# Patient Record
Sex: Female | Born: 1975 | Race: White | Hispanic: No | Marital: Married | Smoking: Current every day smoker
Health system: Southern US, Community
[De-identification: ages and names within clinical notes are randomized; demographics above are authoritative.]

## PROBLEM LIST (undated history)

## (undated) DIAGNOSIS — Z8489 Family history of other specified conditions: Secondary | ICD-10-CM

## (undated) DIAGNOSIS — E039 Hypothyroidism, unspecified: Secondary | ICD-10-CM

## (undated) DIAGNOSIS — R238 Other skin changes: Secondary | ICD-10-CM

## (undated) DIAGNOSIS — M179 Osteoarthritis of knee, unspecified: Secondary | ICD-10-CM

## (undated) DIAGNOSIS — S83206A Unspecified tear of unspecified meniscus, current injury, right knee, initial encounter: Secondary | ICD-10-CM

## (undated) DIAGNOSIS — R233 Spontaneous ecchymoses: Secondary | ICD-10-CM

## (undated) DIAGNOSIS — S83207A Unspecified tear of unspecified meniscus, current injury, left knee, initial encounter: Secondary | ICD-10-CM

## (undated) DIAGNOSIS — M26609 Unspecified temporomandibular joint disorder, unspecified side: Secondary | ICD-10-CM

## (undated) DIAGNOSIS — K5909 Other constipation: Secondary | ICD-10-CM

## (undated) DIAGNOSIS — M1710 Unilateral primary osteoarthritis, unspecified knee: Secondary | ICD-10-CM

## (undated) HISTORY — PX: OTHER SURGICAL HISTORY: SHX169

## (undated) HISTORY — PX: THYROIDECTOMY: SHX17

## (undated) HISTORY — PX: NASAL FRACTURE SURGERY: SHX718

---

## 2003-03-08 HISTORY — PX: OTHER SURGICAL HISTORY: SHX169

## 2005-10-27 ENCOUNTER — Emergency Department: Payer: Self-pay | Admitting: Internal Medicine

## 2006-02-24 ENCOUNTER — Ambulatory Visit: Payer: Self-pay | Admitting: Obstetrics and Gynecology

## 2006-03-07 HISTORY — PX: CHOLECYSTECTOMY: SHX55

## 2006-09-13 ENCOUNTER — Ambulatory Visit: Payer: Self-pay | Admitting: Family Medicine

## 2006-09-15 ENCOUNTER — Ambulatory Visit: Payer: Self-pay | Admitting: Surgery

## 2006-10-17 ENCOUNTER — Emergency Department: Payer: Self-pay | Admitting: Unknown Physician Specialty

## 2006-10-18 ENCOUNTER — Emergency Department (HOSPITAL_COMMUNITY): Admission: EM | Admit: 2006-10-18 | Discharge: 2006-10-18 | Payer: Self-pay | Admitting: Emergency Medicine

## 2006-10-20 ENCOUNTER — Ambulatory Visit (HOSPITAL_COMMUNITY): Admission: RE | Admit: 2006-10-20 | Discharge: 2006-10-20 | Payer: Self-pay | Admitting: Gastroenterology

## 2006-10-22 ENCOUNTER — Inpatient Hospital Stay (HOSPITAL_COMMUNITY): Admission: EM | Admit: 2006-10-22 | Discharge: 2006-10-25 | Payer: Self-pay | Admitting: Emergency Medicine

## 2006-11-01 ENCOUNTER — Ambulatory Visit: Payer: Self-pay | Admitting: Internal Medicine

## 2006-11-02 ENCOUNTER — Ambulatory Visit (HOSPITAL_COMMUNITY): Admission: RE | Admit: 2006-11-02 | Discharge: 2006-11-02 | Payer: Self-pay | Admitting: Gastroenterology

## 2006-12-26 ENCOUNTER — Encounter: Admission: RE | Admit: 2006-12-26 | Discharge: 2006-12-26 | Payer: Self-pay | Admitting: Gastroenterology

## 2007-03-08 HISTORY — PX: ABDOMINAL HYSTERECTOMY: SHX81

## 2007-03-08 HISTORY — PX: HYSTEROSCOPY W/ ENDOMETRIAL ABLATION: SUR665

## 2007-05-17 ENCOUNTER — Ambulatory Visit: Payer: Self-pay | Admitting: Obstetrics and Gynecology

## 2007-05-25 ENCOUNTER — Inpatient Hospital Stay: Payer: Self-pay | Admitting: Obstetrics and Gynecology

## 2007-12-06 ENCOUNTER — Encounter: Admission: RE | Admit: 2007-12-06 | Discharge: 2007-12-06 | Payer: Self-pay | Admitting: Family Medicine

## 2007-12-20 IMAGING — CR DG CHEST 2V
1 series · 2 of 2 positions shown · non-contrast
Comparison: none

REASON FOR EXAM: Chest pain
COMMENTS:   LMP: One week ago

PROCEDURE:     DXR - DXR CHEST PA (OR AP) AND LATERAL  - October 17, 2006  [DATE]
RESULT:     The lung fields are clear. The heart, mediastinal and osseous
structures show no significant abnormalities.

[Series 1: view not recorded · 0.17mm/px · 2 of 2 slices shown]
[im 1/2]
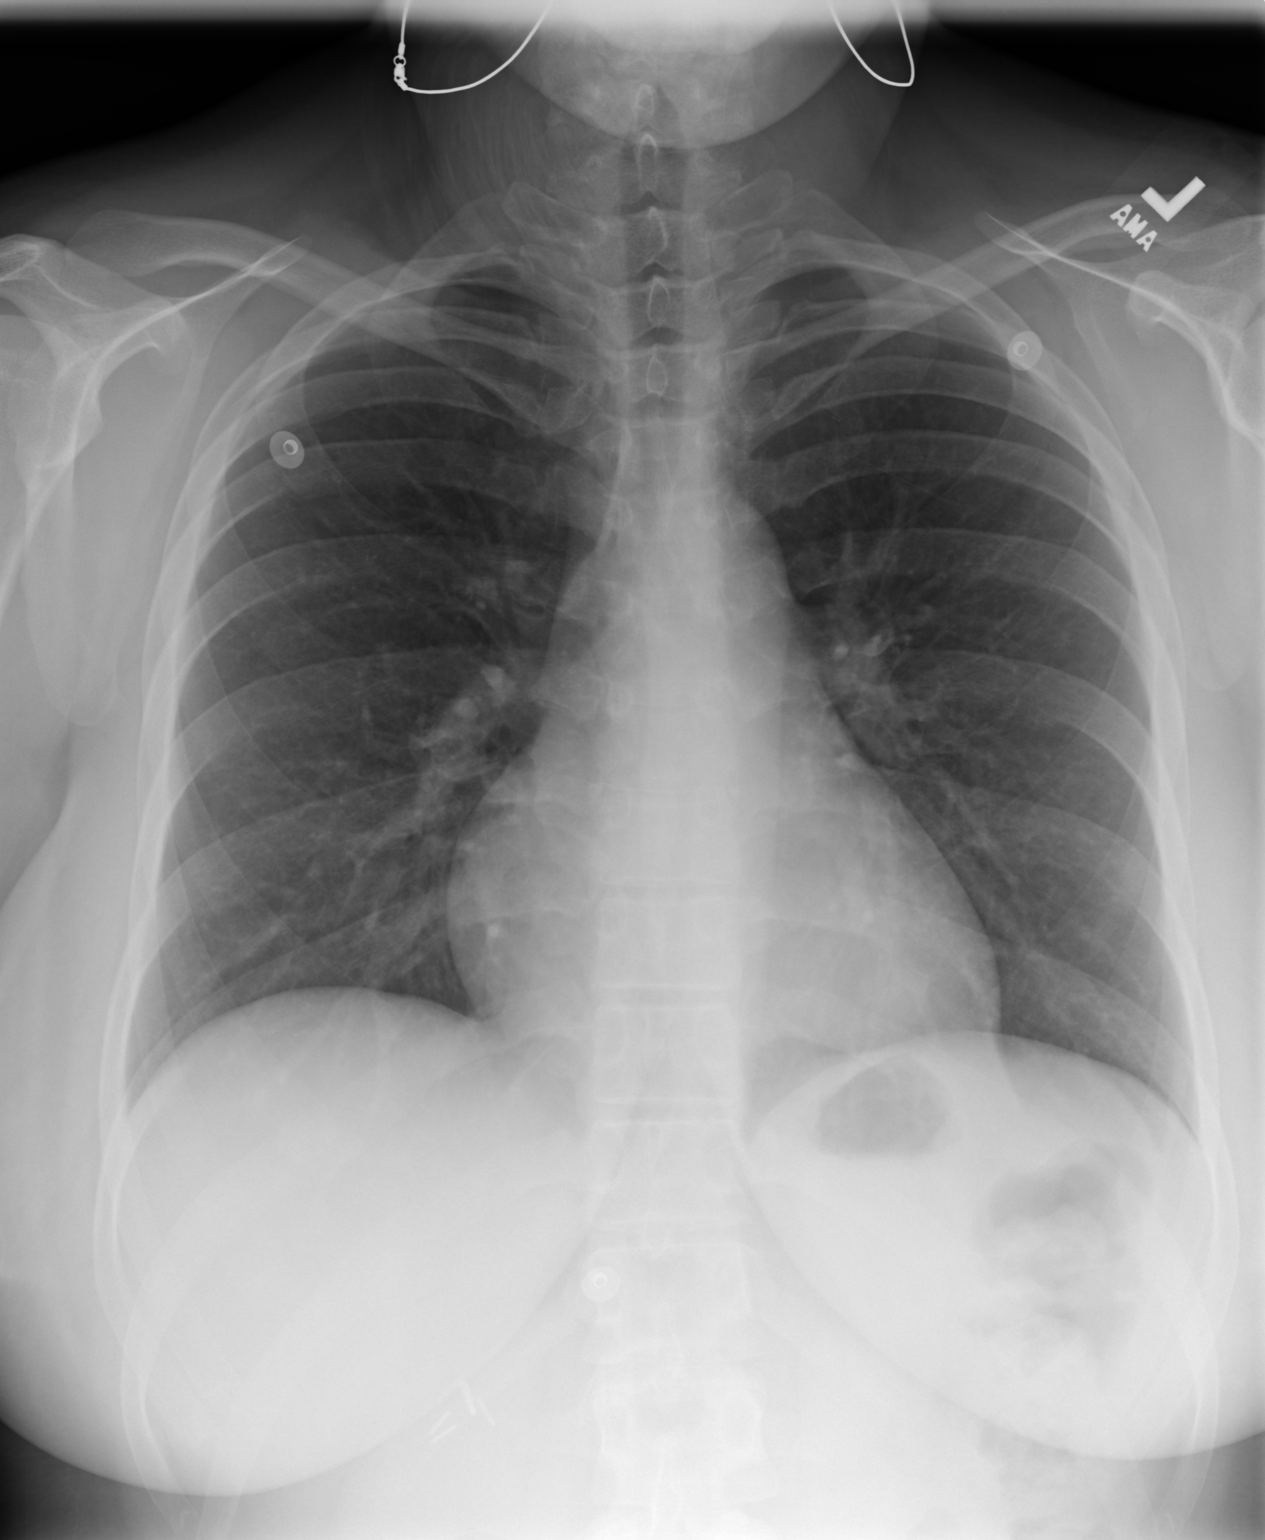
[im 2/2]
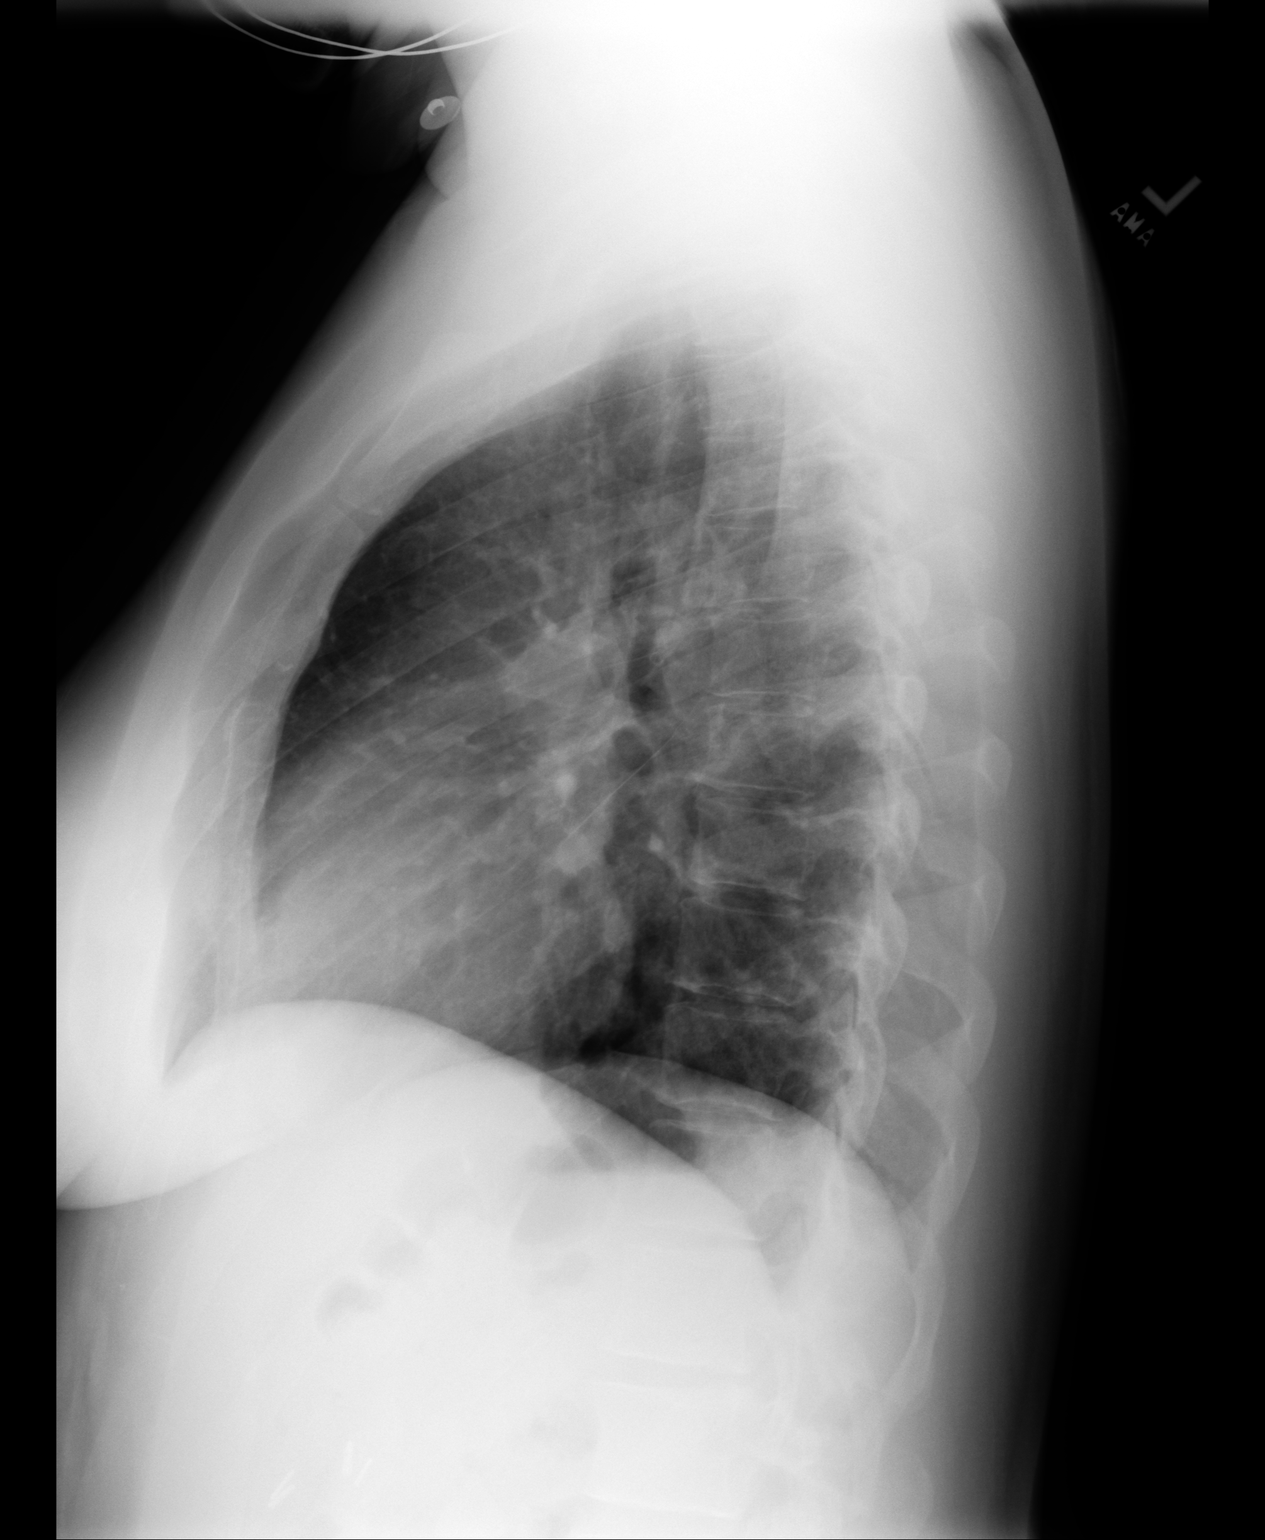

[2 of 2 positions shown; findings below may reference images not displayed]

IMPRESSION: No significant abnormalities are noted.

## 2008-03-04 ENCOUNTER — Ambulatory Visit: Payer: Self-pay | Admitting: Orthopedic Surgery

## 2008-03-15 ENCOUNTER — Ambulatory Visit: Payer: Self-pay | Admitting: Orthopedic Surgery

## 2009-05-04 ENCOUNTER — Ambulatory Visit: Payer: Self-pay | Admitting: Family Medicine

## 2009-07-17 ENCOUNTER — Ambulatory Visit (HOSPITAL_BASED_OUTPATIENT_CLINIC_OR_DEPARTMENT_OTHER): Admission: RE | Admit: 2009-07-17 | Discharge: 2009-07-17 | Payer: Self-pay | Admitting: Specialist

## 2009-07-17 HISTORY — PX: SHOULDER ARTHROSCOPY: SHX128

## 2010-05-25 LAB — BASIC METABOLIC PANEL
BUN: 8 mg/dL (ref 6–23)
CO2: 25 mEq/L (ref 19–32)
Calcium: 8.9 mg/dL (ref 8.4–10.5)
Creatinine, Ser: 0.7 mg/dL (ref 0.4–1.2)
Glucose, Bld: 97 mg/dL (ref 70–99)
Sodium: 140 mEq/L (ref 135–145)

## 2010-05-25 LAB — CBC
HCT: 34.5 % — ABNORMAL LOW (ref 36.0–46.0)
Platelets: 177 10*3/uL (ref 150–400)
WBC: 5.9 10*3/uL (ref 4.0–10.5)

## 2010-07-20 NOTE — Assessment & Plan Note (Signed)
[facility]                                 ON-CALL NOTE   [name], [name]                        MRN:          [id number]  DATE:10/21/2006                            DOB:          September 26, 1975    TELEPHONE NUMBER:  [phone]   CALLER:  Patient's husband.   TIME OF CALL:  5:27am   BODY:  I was contacted by Cassidy Walker's husband. He tells me that she  had an ERCP with stone extraction yesterday. He said that she slept most  of the day yesterday. She woke up with some substernal burning  discomfort. She took a Vicodin. Several hours later, she had some nausea  and vomiting. They noticed some coffee ground type of material. He  mentioned that the discharge instruction sheet said that if they were to  notice to bleeding or coffee ground material to contact a physician.  Aside from nausea and minimal discomfort, rated as a 3, she feels fine.  She is 35 years old with no other medical problems. They do have  Prilosec at home. I told her husband that I think that she may have  gotten sick from the Vicodin on an empty stomach and that the substernal  burning may represent some irritation from the endoscopy or some reflux.  I advised a clear liquid diet, b.i.d. proton pump inhibitor, avoidance  of Vicodin, and anti-medics p.r.n.. I anticipate that she should improve  rapidly. If not, he was instructed to contact me this weekend or Dr.  Elnoria Howard on Monday.     Wilhemina Bonito. Marina Goodell, MD  Electronically Signed    JNP/MedQ  DD: 10/21/2006  DT: 10/22/2006  Job #: [id number]   cc:   Jordan Hawks. Elnoria Howard, MD

## 2010-07-20 NOTE — Discharge Summary (Signed)
NAMESHAHAD, MAZUREK               ACCOUNT NO.:  [id number]   MEDICAL RECORD NO.:  [id number]          PATIENT TYPE:  INP   LOCATION:  1540                         FACILITY:  [facility]   PHYSICIAN:  Jordan Hawks. Elnoria Howard, MD    DATE OF BIRTH:  1976/01/09   DATE OF ADMISSION:  10/22/2006  DATE OF DISCHARGE:  10/25/2006                               DISCHARGE SUMMARY   ADMISSION DIAGNOSIS:  Post-endoscopic retrograde  cholangiopancreatography pancreatitis.   DISCHARGE DIAGNOSES:  1. Abnormal liver enzymes.  2. Resolving choledocholithiasis.   HISTORY OF PRESENT ILLNESS:  Please see the original H&P for full  details.   HOSPITAL COURSE:  The patient was admitted back to the hospital 2 days  after the ERCP.  The patient had complained of the same type of pain  that she had originally presented.  There was no evidence of any new  types of pain.  No nausea or vomiting.  She was subsequently admitted  and treated with ciprofloxacin and pain medications, as well as IV  hydration.  Her liver enzymes and total bilirubin were noted to be  elevated at 6.6, which is higher than prior to the ERCP.  Subsequently,  over the intervening days, on the day of discharge her total bilirubin  did decline down to 5.7.  She was feeling better, and the pain was much  less.  Her utilization of pain medications on markedly decreased.  A CT  scan was performed on hospital day #2 and was negative for any evidence  of acute pancreatitis.  It was felt that the patient had developed edema  at the papilla after the ERCP, which caused a slow resolution of her  symptoms.  Overall, at this time, she is feeling well and able to  tolerate p.o.  Plan for her to be is to be discharged home, and she will  follow up with Dr. Elnoria Howard in 1 week.      Jordan Hawks Elnoria Howard, MD  Electronically Signed     PDH/MEDQ  D:  10/25/2006  T:  10/26/2006  Job:  [id number]

## 2010-12-17 LAB — URINALYSIS, ROUTINE W REFLEX MICROSCOPIC
Glucose, UA: NEGATIVE
Hgb urine dipstick: NEGATIVE
Protein, ur: 30 — AB
Urobilinogen, UA: 1
pH: 6

## 2010-12-17 LAB — HEPATIC FUNCTION PANEL
Albumin: 3.1 — ABNORMAL LOW
Bilirubin, Direct: 4.1 — ABNORMAL HIGH
Indirect Bilirubin: 2.5 — ABNORMAL HIGH
Total Bilirubin: 6.6 — ABNORMAL HIGH

## 2010-12-17 LAB — COMPREHENSIVE METABOLIC PANEL
AST: 87 — ABNORMAL HIGH
Albumin: 2.9 — ABNORMAL LOW
Albumin: 3.6
Alkaline Phosphatase: 177 — ABNORMAL HIGH
Alkaline Phosphatase: 188 — ABNORMAL HIGH
BUN: 1 — ABNORMAL LOW
BUN: 11
BUN: 2 — ABNORMAL LOW
CO2: 27
CO2: 28
Calcium: 8.6
Calcium: 8.7
Chloride: 104
Chloride: 105
Creatinine, Ser: 0.75
GFR calc Af Amer: >60
GFR calc Af Amer: >60
GFR calc non Af Amer: >60
GFR calc non Af Amer: >60
Glucose, Bld: 101 — ABNORMAL HIGH
Glucose, Bld: 118 — ABNORMAL HIGH
Potassium: 3.3 — ABNORMAL LOW
Potassium: 3.5
Sodium: 139
Total Bilirubin: 5.7 — ABNORMAL HIGH
Total Protein: 6
Total Protein: 7.1

## 2010-12-17 LAB — CBC
HCT: 33 — ABNORMAL LOW
HCT: 34 — ABNORMAL LOW
HCT: 34.4 — ABNORMAL LOW
Hemoglobin: 11.5 — ABNORMAL LOW
Hemoglobin: 11.9 — ABNORMAL LOW
Hemoglobin: 12
MCHC: 34.7
MCHC: 34.9
MCV: 95.8
Platelets: 190
Platelets: 212
RBC: 3.45 — ABNORMAL LOW
RDW: 13.5
RDW: 13.8
WBC: 4.8
WBC: 5.7
WBC: 7.3

## 2010-12-17 LAB — DIFFERENTIAL
Eosinophils Absolute: 0.1
Eosinophils Relative: 1
Lymphs Abs: 1.8
Monocytes Relative: 6
Neutrophils Relative %: 68

## 2010-12-17 LAB — LIPASE, BLOOD
Lipase: 154 — ABNORMAL HIGH
Lipase: 46

## 2010-12-20 LAB — POCT PREGNANCY, URINE
Operator id: 239701
Preg Test, Ur: NEGATIVE

## 2010-12-20 LAB — DIFFERENTIAL
Eosinophils Absolute: 0.1
Eosinophils Relative: 1
Lymphocytes Relative: 31
Lymphs Abs: 1.9
Monocytes Absolute: 0.4

## 2010-12-20 LAB — CBC
MCV: 94.8
Platelets: 184
RBC: 3.72 — ABNORMAL LOW
WBC: 6.2

## 2010-12-20 LAB — COMPREHENSIVE METABOLIC PANEL
ALT: 357 — ABNORMAL HIGH
AST: 158 — ABNORMAL HIGH
Albumin: 3.7
CO2: 24
Calcium: 9
Chloride: 106
Creatinine, Ser: 0.64
GFR calc Af Amer: >60
Sodium: 137
Total Bilirubin: 4.9 — ABNORMAL HIGH

## 2010-12-20 LAB — POCT URINALYSIS DIP (DEVICE)
Glucose, UA: 100 — AB
Nitrite: NEGATIVE
Operator id: 239701
Protein, ur: NEGATIVE
Specific Gravity, Urine: >=1.03
Urobilinogen, UA: 1

## 2011-01-13 ENCOUNTER — Encounter (HOSPITAL_BASED_OUTPATIENT_CLINIC_OR_DEPARTMENT_OTHER): Payer: Self-pay | Admitting: *Deleted

## 2011-01-13 NOTE — Progress Notes (Signed)
NPO after MN with except water/gatorade until 0700. Pt to arrive at 1100. Needs hg. Will take synthroid and if needed norco w/ sips of water.

## 2011-01-14 ENCOUNTER — Encounter (HOSPITAL_BASED_OUTPATIENT_CLINIC_OR_DEPARTMENT_OTHER)
Admission: RE | Disposition: A | Discharge status: Home / Self Care | Payer: Self-pay | Source: Ambulatory Visit | Attending: Specialist

## 2011-01-14 ENCOUNTER — Ambulatory Visit (HOSPITAL_BASED_OUTPATIENT_CLINIC_OR_DEPARTMENT_OTHER): Payer: 59 | Admitting: Anesthesiology

## 2011-01-14 ENCOUNTER — Encounter (HOSPITAL_BASED_OUTPATIENT_CLINIC_OR_DEPARTMENT_OTHER): Payer: Self-pay | Admitting: Anesthesiology

## 2011-01-14 ENCOUNTER — Ambulatory Visit (HOSPITAL_BASED_OUTPATIENT_CLINIC_OR_DEPARTMENT_OTHER)
Admission: RE | Admit: 2011-01-14 | Discharge: 2011-01-14 | Disposition: A | Discharge status: Home / Self Care | Payer: 59 | Source: Ambulatory Visit | Attending: Specialist | Admitting: Specialist

## 2011-01-14 DIAGNOSIS — X58XXXA Exposure to other specified factors, initial encounter: Secondary | ICD-10-CM | POA: Insufficient documentation

## 2011-01-14 DIAGNOSIS — Z79899 Other long term (current) drug therapy: Secondary | ICD-10-CM | POA: Insufficient documentation

## 2011-01-14 DIAGNOSIS — E039 Hypothyroidism, unspecified: Secondary | ICD-10-CM | POA: Insufficient documentation

## 2011-01-14 DIAGNOSIS — S83289A Other tear of lateral meniscus, current injury, unspecified knee, initial encounter: Secondary | ICD-10-CM | POA: Insufficient documentation

## 2011-01-14 DIAGNOSIS — F411 Generalized anxiety disorder: Secondary | ICD-10-CM | POA: Insufficient documentation

## 2011-01-14 DIAGNOSIS — S83206A Unspecified tear of unspecified meniscus, current injury, right knee, initial encounter: Secondary | ICD-10-CM

## 2011-01-14 DIAGNOSIS — IMO0002 Reserved for concepts with insufficient information to code with codable children: Secondary | ICD-10-CM | POA: Insufficient documentation

## 2011-01-14 HISTORY — DX: Unspecified tear of unspecified meniscus, current injury, right knee, initial encounter: S83.206A

## 2011-01-14 HISTORY — DX: Hypothyroidism, unspecified: E03.9

## 2011-01-14 HISTORY — PX: KNEE ARTHROSCOPY: SHX127

## 2011-01-14 SURGERY — ARTHROSCOPY, KNEE
Anesthesia: General | Site: Knee | Laterality: Right | Wound class: Clean

## 2011-01-14 MED ORDER — PROPOFOL 10 MG/ML IV EMUL
INTRAVENOUS | Status: DC | PRN
  Administered 2011-01-14: 200 mg via INTRAVENOUS

## 2011-01-14 MED ORDER — CEFAZOLIN SODIUM 1-5 GM-% IV SOLN
1.0000 g | Freq: Once | INTRAVENOUS | Status: AC
  Administered 2011-01-14: 2 g via INTRAVENOUS

## 2011-01-14 MED ORDER — LIDOCAINE HCL (CARDIAC) 20 MG/ML IV SOLN
INTRAVENOUS | Status: DC | PRN
  Administered 2011-01-14: 60 mg via INTRAVENOUS

## 2011-01-14 MED ORDER — LACTATED RINGERS IV SOLN
INTRAVENOUS | Status: DC
  Administered 2011-01-14 (×3): via INTRAVENOUS

## 2011-01-14 MED ORDER — HYDROMORPHONE HCL PF 1 MG/ML IJ SOLN: 0.2500 mg | INTRAMUSCULAR | Status: DC | PRN

## 2011-01-14 MED ORDER — FENTANYL CITRATE 0.05 MG/ML IJ SOLN: 25.0000 ug | INTRAMUSCULAR | Status: DC | PRN

## 2011-01-14 MED ORDER — FENTANYL CITRATE 0.05 MG/ML IJ SOLN
25.0000 ug | INTRAMUSCULAR | Status: DC | PRN
  Administered 2011-01-14: 25 ug via INTRAVENOUS
  Administered 2011-01-14: 50 ug via INTRAVENOUS

## 2011-01-14 MED ORDER — ONDANSETRON HCL 4 MG/2ML IJ SOLN
INTRAMUSCULAR | Status: DC | PRN
  Administered 2011-01-14: 4 mg via INTRAVENOUS

## 2011-01-14 MED ORDER — HYDROCODONE-ACETAMINOPHEN 7.5-325 MG PO TABS
1.0000 | ORAL_TABLET | Freq: Once | ORAL | Status: AC
  Administered 2011-01-14: 1 via ORAL

## 2011-01-14 MED ORDER — EPINEPHRINE HCL 1 MG/ML IJ SOLN
INTRAMUSCULAR | Status: DC | PRN
  Administered 2011-01-14: 2 mg

## 2011-01-14 MED ORDER — FENTANYL CITRATE 0.05 MG/ML IJ SOLN
INTRAMUSCULAR | Status: DC | PRN
  Administered 2011-01-14 (×4): 50 ug via INTRAVENOUS

## 2011-01-14 MED ORDER — PROMETHAZINE HCL 25 MG/ML IJ SOLN: 6.2500 mg | INTRAMUSCULAR | Status: DC | PRN

## 2011-01-14 MED ORDER — SODIUM CHLORIDE 0.9 % IR SOLN
Status: DC | PRN
  Administered 2011-01-14: 2

## 2011-01-14 MED ORDER — BUPIVACAINE-EPINEPHRINE 0.5% -1:200000 IJ SOLN
INTRAMUSCULAR | Status: DC | PRN
  Administered 2011-01-14: 25 mL

## 2011-01-14 SURGICAL SUPPLY — 40 items
BANDAGE ELASTIC 6 VELCRO ST LF (GAUZE/BANDAGES/DRESSINGS) ×2 IMPLANT
BLADE 4.2CUDA (BLADE) IMPLANT
BLADE CUDA SHAVER 3.5 (BLADE) ×2 IMPLANT
BLADE GREAT WHITE 4.2 (BLADE) IMPLANT
BOOTIES KNEE HIGH SLOAN (MISCELLANEOUS) ×2 IMPLANT
CANISTER SUCT LVC 12 LTR MEDI- (MISCELLANEOUS) ×2 IMPLANT
CANISTER SUCTION 1200CC (MISCELLANEOUS) IMPLANT
CANISTER SUCTION 2500CC (MISCELLANEOUS) ×2 IMPLANT
CANNULA ACUFLEX KIT 5X76 (CANNULA) IMPLANT
CLOTH BEACON ORANGE TIMEOUT ST (SAFETY) ×2 IMPLANT
CUTTER MENISCUS  4.2MM (BLADE)
CUTTER MENISCUS 4.2MM (BLADE) IMPLANT
DRAPE ARTHROSCOPY W/POUCH 114 (DRAPES) ×2 IMPLANT
DRSG PAD ABDOMINAL 8X10 ST (GAUZE/BANDAGES/DRESSINGS) ×2 IMPLANT
DURAPREP 26ML APPLICATOR (WOUND CARE) ×2 IMPLANT
ELECT REM PT RETURN 9FT ADLT (ELECTROSURGICAL)
ELECTRODE REM PT RTRN 9FT ADLT (ELECTROSURGICAL) IMPLANT
GLOVE BIOGEL M 6.5 STRL (GLOVE) ×2 IMPLANT
GLOVE ECLIPSE 6.0 STRL STRAW (GLOVE) ×2 IMPLANT
GLOVE SURG SS PI 8.0 STRL IVOR (GLOVE) ×2 IMPLANT
GOWN PREVENTION PLUS LG XLONG (DISPOSABLE) ×2 IMPLANT
GOWN STRL REIN XL XLG (GOWN DISPOSABLE) ×2 IMPLANT
KNEE WRAP E Z 3 GEL PACK (MISCELLANEOUS) ×2 IMPLANT
MINI VAC (SURGICAL WAND) IMPLANT
NDL SAFETY ECLIPSE 18X1.5 (NEEDLE) ×2 IMPLANT
NEEDLE FILTER BLUNT 18X 1/2SAF (NEEDLE) ×1
NEEDLE FILTER BLUNT 18X1 1/2 (NEEDLE) ×1 IMPLANT
NEEDLE HYPO 18GX1.5 SHARP (NEEDLE) ×2
PACK ARTHROSCOPY DSU (CUSTOM PROCEDURE TRAY) ×2 IMPLANT
PACK BASIN DAY SURGERY FS (CUSTOM PROCEDURE TRAY) ×2 IMPLANT
PADDING CAST COTTON 6X4 STRL (CAST SUPPLIES) ×2 IMPLANT
SET ARTHROSCOPY TUBING (MISCELLANEOUS) ×1
SET ARTHROSCOPY TUBING LN (MISCELLANEOUS) ×1 IMPLANT
SPONGE GAUZE 4X4 12PLY (GAUZE/BANDAGES/DRESSINGS) ×2 IMPLANT
SUT ETHILON 4 0 PS 2 18 (SUTURE) ×2 IMPLANT
SYR 30ML LL (SYRINGE) ×2 IMPLANT
SYRINGE 10CC LL (SYRINGE) ×2 IMPLANT
TOWEL OR 17X24 6PK STRL BLUE (TOWEL DISPOSABLE) ×2 IMPLANT
WAND 90 DEG TURBOVAC W/CORD (SURGICAL WAND) IMPLANT
WATER STERILE IRR 500ML POUR (IV SOLUTION) ×2 IMPLANT

## 2011-01-14 NOTE — Brief Op Note (Signed)
01/14/2011  2:05 PM  PATIENT:  Cassidy Walker  35 y.o. female  PRE-OPERATIVE DIAGNOSIS:  MEDIAL MENISCAL TEAR RIGHT KNEE  POST-OPERATIVE DIAGNOSIS:  Medial Meniscal Tear Right knee  PROCEDURE:  Procedure(s): ARTHROSCOPY KNEE  SURGEON:  Surgeon(s): Javier Docker  PHYSICIAN ASSISTANT:   ASSISTANTS: none   ANESTHESIA:   general  EBL:  Total I/O In: 300 [I.V.:300] Out: -   BLOOD ADMINISTERED:none  DRAINS: none   LOCAL MEDICATIONS USED:  MARCAINE 25CC  SPECIMEN:  No Specimen  DISPOSITION OF SPECIMEN:  N/A  COUNTS:  YES  TOURNIQUET:  * No tourniquets in log *      PATIENT DISPOSITION:  PACU - hemodynamically stable.

## 2011-01-14 NOTE — Transfer of Care (Signed)
Immediate Anesthesia Transfer of Care Note  Patient: Cassidy Walker  Procedure(s) Performed:  ARTHROSCOPY KNEE - partial and medial menisectomy WITH DEBRIDEMENT  Patient Location: PACU  Anesthesia Type: General  Level of Consciousness: awake, oriented, sedated and patient cooperative  Airway & Oxygen Therapy: Patient Spontanous Breathing and Patient connected to face mask oxygen  Post-op Assessment: Report given to PACU RN and Post -op Vital signs reviewed and stable  Post vital signs: Reviewed and stable  Complications: No apparent anesthesia complications

## 2011-01-14 NOTE — H&P (Signed)
Cassidy Walker is an 35 y.o. female.   Chief Complaintright knee pain HPI:  Patient complains of right knee pain with out relief from conservative treatment.  They note continued mechanical knee symptoms.  It is felt that the patient would benefit from knee arthroscopy at this time. Risks and benefits of surgery discussed with patient and they wish to proceed. Past Medical History  Diagnosis Date  . Hypothyroidism     s/p right thyroidectomy  . Anxiety   . Right knee meniscal tear     pain/swelling  . Insomnia   . Complication of anesthesia     jaw tends to lock    Past Surgical History  Procedure Date  . Shoulder arthroscopy 07-17-09    w/ debridement of labral and partial rotator cuff tear  . Abdominal hysterectomy 2009  . Hysteroscopy w/ endometrial ablation 2009  . Cholecystectomy 2008  . Bladder tack 10 yrs ago  . Thyroidectomy 11 yrs ago    right  . Cesarean section 2007    History reviewed. No pertinent family history. Social History:  reports that she quit smoking about 3 years ago. Her smoking use included Cigarettes. She has never used smokeless tobacco. She reports that she does not drink alcohol or use illicit drugs.  Allergies:  Allergies  Allergen Reactions  . Wellbutrin (Bupropion Hcl) Rash    Medications Prior to Admission  Medication Dose Route Frequency Provider Last Rate Last Dose  . ceFAZolin (ANCEF) IVPB 1 g/50 mL premix  1 g Intravenous Once [employer]      . lactated ringers infusion   Intravenous Continuous Phillips Grout, MD 20 mL/hr at 01/14/11 1124     Medications Prior to Admission  Medication Sig Dispense Refill  . HYDROcodone-acetaminophen (NORCO) 7.5-325 MG per tablet Take 1 tablet by mouth every 6 (six) hours as needed.        Marland Kitchen levothyroxine (SYNTHROID, LEVOTHROID) 175 MCG tablet Take 175 mcg by mouth every morning.        . zolpidem (AMBIEN CR) 12.5 MG CR tablet Take 12.5 mg by mouth at bedtime as needed.          No results  found for this or any previous visit (from the past 48 hour(s)). No results found.  Review of Systems: The patient denies any fever, chills, night sweats, or bleeding tendencies. CNS: No blurred double vision, seizure, headache, paralysis. RESPIRATORY: No shortness of breath, productive cough, or hemoptysis. CARDIOVASCULAR: No chest pain, angina, or orthopnea, GU: No dysuria, hematuria, or discharge. MUSCULOSKELETAL: Pertinent per HPI.  Blood pressure 112/66, pulse 65, temperature 98.3 F (36.8 C), temperature source Oral, resp. rate 18, height 5\' 9"  (1.753 m), weight 90.719 kg (200 lb), SpO2 100.00%.  GENERAL: Patient is a 35 y.o. female, well-nourished, well-developed, no acute distress. Alert, oriented, cooperative. HENT:  Normocephalic, atraumatic. Pupils round and reactive. EOMs intact. NECK:  Supple, no bruits. CHEST:  Clear to anterior and posterior chest walls. No rhonchi, rales, wheezes. HEART:  Regular, rate and rhythm.  No murmurs.  S1 and S2 noted. ABDOMEN:  Soft, nontender, bowel sounds present. RECTAL/BREAST/GENITALIA:  Not done, not pertinent to present illness. EXTREMITIES:medial joint pain. Positive  effusion.  Assessment/Plan Medial meniscus tear right knee  Jourdin Connors C 01/14/2011, 1:23 PM

## 2011-01-14 NOTE — Progress Notes (Signed)
Reported to B. Johnson, RN as caregiver 

## 2011-01-14 NOTE — Anesthesia Procedure Notes (Addendum)
Procedure Name: LMA Insertion Date/Time: 01/14/2011 1:36 PM Performed by: Lorrin Jackson Pre-anesthesia Checklist: Patient identified, Emergency Drugs available, Suction available and Patient being monitored Patient Re-evaluated:Patient Re-evaluated prior to inductionOxygen Delivery Method: Circle System Utilized Preoxygenation: Pre-oxygenation with 100% oxygen Intubation Type: IV induction Ventilation: Mask ventilation without difficulty LMA: LMA with gastric port inserted LMA Size: 4.0 Number of attempts: 1 Placement Confirmation: positive ETCO2 and breath sounds checked- equal and bilateral Tube secured with: Tape Dental Injury: Teeth and Oropharynx as per pre-operative assessment

## 2011-01-14 NOTE — Anesthesia Postprocedure Evaluation (Signed)
  Anesthesia Post-op Note  Patient: Cassidy Walker  Procedure(s) Performed:  ARTHROSCOPY KNEE - partial and medial menisectomy WITH DEBRIDEMENT  Patient Location: PACU  Anesthesia Type: General  Level of Consciousness: oriented and sedated  Airway and Oxygen Therapy: Patient Spontanous Breathing  Post-op Pain: mild  Post-op Assessment: Post-op Vital signs reviewed, Patient's Cardiovascular Status Stable, Respiratory Function Stable and Patent Airway  Post-op Vital Signs: stable  Complications: No apparent anesthesia complications

## 2011-01-14 NOTE — Anesthesia Preprocedure Evaluation (Signed)
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 States she has dislocated her jaw in past Dental No notable dental hx.    Pulmonary neg pulmonary ROS,  clear to auscultation  Pulmonary exam normal       Cardiovascular Exercise Tolerance: Good neg cardio ROS regular Normal    Neuro/Psych Negative Neurological ROS  Negative Psych ROS   GI/Hepatic negative GI ROS, Neg liver ROS,   Endo/Other  Hyperthyroidism   Renal/GU negative Renal ROS  Genitourinary negative   Musculoskeletal   Abdominal   Peds  Hematology negative hematology ROS (+)   Anesthesia Other Findings   Reproductive/Obstetrics negative OB ROS                           Anesthesia Physical Anesthesia Plan  ASA: II  Anesthesia Plan: General   Post-op Pain Management:    Induction:   Airway Management Planned:   Additional Equipment:   Intra-op Plan:   Post-operative Plan:   Informed Consent: I have reviewed the patients History and Physical, chart, labs and discussed the procedure including the risks, benefits and alternatives for the proposed anesthesia with the patient or authorized representative who has indicated his/her understanding and acceptance.   Dental Advisory Given  Plan Discussed with: CRNA  Anesthesia Plan Comments:         Anesthesia Quick Evaluation

## 2011-01-14 NOTE — Op Note (Signed)
Dictation number [id number].

## 2011-01-17 NOTE — Op Note (Signed)
NAMECHANTRICE, HAGG NO.:  [id number]  MEDICAL RECORD NO.:  [id number]  LOCATION:                               FACILITY:  [facility]  PHYSICIAN:  Jene Every, M.D.    DATE OF BIRTH:  1975-10-31  DATE OF PROCEDURE:  01/14/2011 DATE OF DISCHARGE:                              OPERATIVE REPORT   PREOPERATIVE DIAGNOSIS:  Medial meniscus tear of the right knee.  POSTOPERATIVE DIAGNOSIS:  Medial meniscus tear of the right knee, lateral meniscal tear.  PROCEDURES PERFORMED:  Right knee arthroscopy followed by partial medial and lateral meniscectomy.  ANESTHESIA:  General.  ASSISTANT:  None.  BRIEF HISTORY:  This is a young gentleman with mechanical problems secondary to meniscal tear of the right knee medially.  It was indicated for arthroscopic debridement, failing conservative treatment.  Risks and benefits were discussed including bleeding, infection, damage to vascular structures, no change in symptoms, worsening symptoms, need for repeat debridement, DVT, PE, anesthetic complication etc.  TECHNIQUE:  With the patient in supine position after induction of adequate anesthesia with 1 g Kefzol, the right lower extremity was prepped and draped in the usual sterile fashion.  A lateral parapatellar portal was fashioned with a #11 blade.  The Ingress cannula was atraumatically placed.  Irrigant was utilized to insufflate the joint. Under direct visualization, medial parapatellar portal was fashioned with a #11 blade after localization with an 18-gauge needle sparing the medial meniscus.  Noted was a tear of the posterior third of the medial meniscus and degenerative fraying.  I introduced a basket rongeur and resected it to a stable base, 20% of the posterior third was excised. Remnant was stable to probe palpation.  The chondral surfaces were unremarkable.  ACL and PCL were unremarkable.  The lateral compartment revealed a radial tear at the 10  o'clock position.  I introduced a basket and resected it to a stable base. Further contoured with a 3.5 Cuda shaver.  Femoral condyle and tibial plateau were unremarkable.  Suprapatellar pouch, normal patellofemoral tracking.  Chondral surfaces were unremarkable.  Gutters were unremarkable.  Copiously lavaged the knee, revisited all compartments.  No further pathology amenable for arthroscopic intervention.  I therefore removed all instrumentation. Portals were closed with 4-0 nylon simple sutures.  0.25% Marcaine with epinephrine was infiltrated into the joint.  The wound was dressed sterilely.  Awoken without difficulty and transported to recovery room in satisfactory condition.  The patient tolerated the procedure well.  There were no complications. No assistant.  Minimal blood loss.     Jene Every, M.D.     [name]  D:  01/14/2011  T:  01/14/2011  Job:  [id number]

## 2011-01-17 NOTE — Op Note (Signed)
NAMEHAILEIGH, Cassidy Walker NO.:  [id number]  MEDICAL RECORD NO.:  [id number]  LOCATION:                                 FACILITY:  PHYSICIAN:  Jene Every, M.D.    DATE OF BIRTH:  [birth year]  DATE OF PROCEDURE:  01/14/2011 DATE OF DISCHARGE:                              OPERATIVE REPORT   PREOPERATIVE DIAGNOSIS:  Medial meniscus tear of the right knee.  POSTOP DIAGNOSIS:  Medial meniscus tear of the right knee, lateral meniscus tear.  PROCEDURE PERFORMED:  Right knee arthroscopy followed by partial medial and lateral meniscectomy.  ANESTHESIA:  General.  ASSISTANT:  None.  BRIEF HISTORY:  This is a 35 year old female with locking, popping, giving away medial meniscus tear __________ medial joint line fracture. Conservative treatment indicated for arthroscopic debridement and evaluation of meniscus.  Risks and benefits were discussed including bleeding, infection, damage to other vascular stuctures, no change in symptoms, worsening symptoms, need for repeat debridement, DVT, PE, anesthetic complications etc.  TECHNIQUE:  With the patient in supine position, after induction of adequate general anesthesia, 1 g Kefzol, the right lower extremity was prepped and draped in the usual sterile fashion.  A lateral parapatellar portal was fashioned with a #11 blade, ingress cannula atraumatically placed.  Irrigant was utilized to insufflate the joint.  Under direct visualization, a medial parapatellar portal was fashioned with a #11 blade after localization with an 18-gauge needle sparing the medial meniscus.  Examination of medial meniscus, posterior third of the medial meniscus tear with degenerative fraying.  Chondral surfaces were unremarkable.  I introduced a basket and resected approximately 20% of the posterior third to a stable base, further contoured with a 3.5 Cuda shaver.  The remnant was stable.  The tibia and femur were unremarkable. ACL was  unremarkable.  Lateral compartment revealed radial tear in the lateral meniscus, in the 10 o'clock position.  This was resected with a small basket rongeur to a stable base, further contoured with 3.5 Cuda shaver .  Remnant was stable for palpation.  Femoral condyle, tibial plateau were unremarkable.  Suprapatellar pouch was unremarkable.  No patellofemoral tracking, no chondral defects, gutters were unremarkable.  Copiously lavaged the knee.  Reexamined all compartments, no further pathology amenable to arthroscopic intervention, I therefore removed all instrumentation. Portals were closed with 4-0 nylon simple sutures, 0.25% Marcaine with epinephrine was infiltrated in the joint. Wounds dressed sterilely.  Awoken without difficulty and transported to recovery in satisfactory condition.  The patient tolerated the procedure well.  No complication.  Minimal blood loss.     Jene Every, M.D.     [name]  D:  01/14/2011  T:  01/14/2011  Job:  [id number]

## 2011-01-19 ENCOUNTER — Telehealth: Payer: Self-pay | Admitting: Specialist

## 2011-01-19 ENCOUNTER — Encounter (HOSPITAL_BASED_OUTPATIENT_CLINIC_OR_DEPARTMENT_OTHER): Payer: Self-pay | Admitting: Specialist

## 2011-01-29 NOTE — Telephone Encounter (Signed)
fyi

## 2012-05-11 ENCOUNTER — Emergency Department (HOSPITAL_COMMUNITY)
Admission: EM | Admit: 2012-05-11 | Discharge: 2012-05-11 | Disposition: A | Discharge status: Home / Self Care | Payer: 59 | Attending: Emergency Medicine | Admitting: Emergency Medicine

## 2012-05-11 ENCOUNTER — Emergency Department (HOSPITAL_COMMUNITY): Payer: 59

## 2012-05-11 ENCOUNTER — Encounter (HOSPITAL_COMMUNITY): Payer: Self-pay | Admitting: Emergency Medicine

## 2012-05-11 DIAGNOSIS — Z8659 Personal history of other mental and behavioral disorders: Secondary | ICD-10-CM | POA: Insufficient documentation

## 2012-05-11 DIAGNOSIS — R42 Dizziness and giddiness: Secondary | ICD-10-CM | POA: Insufficient documentation

## 2012-05-11 DIAGNOSIS — Z79899 Other long term (current) drug therapy: Secondary | ICD-10-CM | POA: Insufficient documentation

## 2012-05-11 DIAGNOSIS — Z3202 Encounter for pregnancy test, result negative: Secondary | ICD-10-CM | POA: Insufficient documentation

## 2012-05-11 DIAGNOSIS — Z87891 Personal history of nicotine dependence: Secondary | ICD-10-CM | POA: Insufficient documentation

## 2012-05-11 DIAGNOSIS — E039 Hypothyroidism, unspecified: Secondary | ICD-10-CM | POA: Insufficient documentation

## 2012-05-11 DIAGNOSIS — G4730 Sleep apnea, unspecified: Secondary | ICD-10-CM | POA: Insufficient documentation

## 2012-05-11 DIAGNOSIS — Z87828 Personal history of other (healed) physical injury and trauma: Secondary | ICD-10-CM | POA: Insufficient documentation

## 2012-05-11 LAB — BASIC METABOLIC PANEL
BUN: 7 mg/dL (ref 6–23)
CO2: 27 mEq/L (ref 19–32)
Chloride: 104 mEq/L (ref 96–112)
Creatinine, Ser: 0.68 mg/dL (ref 0.50–1.10)
GFR calc Af Amer: >90 mL/min (ref >90)
Glucose, Bld: 106 mg/dL — ABNORMAL HIGH (ref 70–99)
Potassium: 3.8 mEq/L (ref 3.5–5.1)

## 2012-05-11 LAB — CBC WITH DIFFERENTIAL/PLATELET
Basophils Absolute: 0 10*3/uL (ref 0.0–0.1)
Basophils Relative: 0 % (ref 0–1)
Eosinophils Absolute: 0 10*3/uL (ref 0.0–0.7)
Eosinophils Relative: 0 % (ref 0–5)
HCT: 35.7 % — ABNORMAL LOW (ref 36.0–46.0)
Hemoglobin: 12.6 g/dL (ref 12.0–15.0)
MCH: 31.8 pg (ref 26.0–34.0)
MCHC: 35.3 g/dL (ref 30.0–36.0)
MCV: 90.2 fL (ref 78.0–100.0)
Monocytes Absolute: 0.4 10*3/uL (ref 0.1–1.0)
Monocytes Relative: 6 % (ref 3–12)
RDW: 11.9 % (ref 11.5–15.5)

## 2012-05-11 LAB — URINALYSIS, ROUTINE W REFLEX MICROSCOPIC
Bilirubin Urine: NEGATIVE
Glucose, UA: NEGATIVE mg/dL
Hgb urine dipstick: NEGATIVE
Protein, ur: NEGATIVE mg/dL
Urobilinogen, UA: 0.2 mg/dL (ref 0.0–1.0)

## 2012-05-11 MED ORDER — MECLIZINE HCL 50 MG PO TABS: 25.0000 mg | ORAL_TABLET | Freq: Three times a day (TID) | ORAL | Status: DC | PRN

## 2012-05-11 MED ORDER — MECLIZINE HCL 12.5 MG PO TABS
25.0000 mg | ORAL_TABLET | Freq: Once | ORAL | Status: AC
  Administered 2012-05-11: 25 mg via ORAL
  Filled 2012-05-11: qty 2

## 2012-05-11 MED ORDER — LORAZEPAM 1 MG PO TABS: 1.0000 mg | ORAL_TABLET | Freq: Three times a day (TID) | ORAL | Status: DC | PRN

## 2012-05-11 NOTE — ED Provider Notes (Signed)
History     CSN: [id number]  Arrival date & time 05/11/12  1059   First MD Initiated Contact with Patient 05/11/12 1116      Chief Complaint  Patient presents with  . Dizziness    (Consider location/radiation/quality/duration/timing/severity/associated sxs/prior treatment) HPI... dizziness since last and left arm felt tinglynight. Felt Lightheaded and head was spinning. Left side of face . Normal mental status. Normal ambulation. No stiff neck, fever, sweats, chills.nothing makes symptoms better or worse. Patient takes Synthroid for thyroid replacement  Past Medical History  Diagnosis Date  . Hypothyroidism     s/p right thyroidectomy  . Anxiety   . Right knee meniscal tear     pain/swelling  . Insomnia   . Complication of anesthesia     jaw tends to lock    Past Surgical History  Procedure Laterality Date  . Shoulder arthroscopy  07-17-09    w/ debridement of labral and partial rotator cuff tear  . Abdominal hysterectomy  2009  . Hysteroscopy w/ endometrial ablation  2009  . Cholecystectomy  2008  . Bladder tack  10 yrs ago  . Thyroidectomy  11 yrs ago    right  . Cesarean section  2007  . Knee arthroscopy  01/14/2011    Procedure: ARTHROSCOPY KNEE;  Surgeon: Javier Docker;  Location: [facility];  Service: Orthopedics;  Laterality: Right;  partial and medial menisectomy WITH DEBRIDEMENT    History reviewed. No pertinent family history.  History  Substance Use Topics  . Smoking status: Former Smoker    Types: Cigarettes    Quit date: 01/13/2008  . Smokeless tobacco: Never Used  . Alcohol Use: No    OB History   Grav Para Term Preterm Abortions TAB SAB Ect Mult Living                  Review of Systems  All other systems reviewed and are negative.    Allergies  Wellbutrin  Home Medications   Current Outpatient Rx  Name  Route  Sig  Dispense  Refill  . levothyroxine (SYNTHROID, LEVOTHROID) 175 MCG tablet   Oral   Take 175 mcg by  mouth every morning.           . lubiprostone (AMITIZA) 24 MCG capsule   Oral   Take 24 mcg by mouth 2 (two) times daily with a meal.         . zolpidem (AMBIEN CR) 12.5 MG CR tablet   Oral   Take 12.5 mg by mouth at bedtime as needed for sleep.            BP 98/54  Pulse 71  Temp(Src) 98.2 F (36.8 C) (Oral)  Resp 20  Ht 5\' 9"  (1.753 m)  Wt 175 lb (79.379 kg)  BMI 25.83 kg/m2  SpO2 100%  Physical Exam  Nursing note and vitals reviewed. Constitutional: She is oriented to person, place, and time. She appears well-developed and well-nourished.  HENT:  Head: Normocephalic and atraumatic.  Eyes: Conjunctivae and EOM are normal. Pupils are equal, round, and reactive to light.  Neck: Normal range of motion. Neck supple.  Cardiovascular: Normal rate, regular rhythm and normal heart sounds.   Pulmonary/Chest: Effort normal and breath sounds normal.  Abdominal: Soft. Bowel sounds are normal.  Musculoskeletal: Normal range of motion.  Neurological: She is alert and oriented to person, place, and time.  Skin: Skin is warm and dry.  Psychiatric: She has a normal mood and  affect.    ED Course  Procedures (including critical care time)  Labs Reviewed  BASIC METABOLIC PANEL - Abnormal; Notable for the following:    Glucose, Bld 106 (*)    All other components within normal limits  CBC WITH DIFFERENTIAL - Abnormal; Notable for the following:    HCT 35.7 (*)    All other components within normal limits  URINALYSIS, ROUTINE W REFLEX MICROSCOPIC - Abnormal; Notable for the following:    Specific Gravity, Urine <1.005 (*)    All other components within normal limits  PREGNANCY, URINE   Ct Head Wo Contrast  05/11/2012  *RADIOLOGY REPORT*  Clinical Data: Dizziness with left facial and arm numbness.  CT HEAD WITHOUT CONTRAST  Technique:  Contiguous axial images were obtained from the base of the skull through the vertex without contrast.  Comparison: None.  Findings: There is no  evidence of acute intracranial hemorrhage, mass lesion, brain edema or extra-axial fluid collection.  The ventricles and subarachnoid spaces are appropriately sized for age. There is no CT evidence of acute cortical infarction.  The visualized paranasal sinuses are clear. The calvarium is intact.  IMPRESSION: No acute or reversible intracranial findings identified.   Original Report Authenticated By: Carey Bullocks, M.D.     Date: 05/11/2012  Rate: 79  Rhythm: normal sinus rhythm  QRS Axis: normal  Intervals: normal  ST/T Wave abnormalities: normal  Conduction Disutrbances: none  Narrative Interpretation: unremarkable     No diagnosis found.    MDM  No good explanation for patient's vertigo.  She has normal mentation and full range of motion of all extremities. CT head was negative. EKG shows normal sinus rhythm. Hemoglobin stable.        Donnetta Hutching, MD 05/11/12 1501

## 2012-05-11 NOTE — ED Notes (Signed)
Pt states got up out of chair last night to fix daughter something to eat and felt very dizzy. Pt states went to bed and dizziness remain. Pt complaining of left sided weakness since 7pm last night

## 2012-05-24 ENCOUNTER — Ambulatory Visit: Payer: Self-pay | Admitting: Surgery

## 2012-06-12 ENCOUNTER — Ambulatory Visit: Payer: Self-pay | Admitting: Otolaryngology

## 2013-02-26 ENCOUNTER — Other Ambulatory Visit: Payer: Self-pay | Admitting: Orthopedic Surgery

## 2013-03-25 ENCOUNTER — Other Ambulatory Visit: Payer: Self-pay | Admitting: Orthopedic Surgery

## 2013-03-25 NOTE — H&P (Signed)
Cassidy Walker is an 38 y.o. female.   Chief Complaint: Right shoulder pain HPI: The patient is a 38 year old female being followed for their right shoulder pain. They are year(s) out from when symptoms began. Symptoms reported today include: pain, catching, popping and pain with weightbearing, while the patient does not report symptoms of: instability. The patient feels that they are doing poorly. Current treatment includes: activity modification and NSAIDs. The following medication has been used for pain control: antiinflammatory medication. The patient presents today following MRI. The patient has reported symptom improvement with: activity modification while they have not gotten any relief of their symptoms with: Cortisone injections (relief from lidocaine only) or NSAIDs (ibuprofen).  She reports the injection from last visit gave relief for about 30 minutes (lidocaine only) but did not help beyond that. She did go on her [place] trip and was able to manage with the pain. She reports she has still been using the arm but has tried to modify activity to avoid overhead use and abduction. Prior injection about a year and a half ago was more helpful. She has been taking the Norco from last visit but it has been causing stomach pain. Reports she has had ultram in the past which was better tolerated. She had Norco previously as well and does not recall this issue. She tried Ibuprofen with no significant relief. She does report popping and catching with use, denies instability.   Past Medical History  Diagnosis Date  . Hypothyroidism     s/p right thyroidectomy  . Anxiety   . Right knee meniscal tear     pain/swelling  . Insomnia   . Complication of anesthesia     jaw tends to lock    Past Surgical History  Procedure Laterality Date  . Shoulder arthroscopy  07-17-09    w/ debridement of labral and partial rotator cuff tear  . Abdominal hysterectomy  2009  . Hysteroscopy w/ endometrial ablation   2009  . Cholecystectomy  2008  . Bladder tack  10 yrs ago  . Thyroidectomy  11 yrs ago    right  . Cesarean section  2007  . Knee arthroscopy  01/14/2011    Procedure: ARTHROSCOPY KNEE;  Surgeon: Javier Docker;  Location: [facility];  Service: Orthopedics;  Laterality: Right;  partial and medial menisectomy WITH DEBRIDEMENT    No family history on file. Social History:  reports that she quit smoking about 5 years ago. Her smoking use included Cigarettes. She smoked 0.00 packs per day. She has never used smokeless tobacco. She reports that she does not drink alcohol or use illicit drugs.  Allergies:  Allergies  Allergen Reactions  . Wellbutrin [Bupropion Hcl] Rash     (Not in a hospital admission)  No results found for this or any previous visit (from the past 48 hour(s)). No results found.  Review of Systems  Constitutional: Negative.   HENT: Negative.   Eyes: Negative.   Respiratory: Negative.   Cardiovascular: Negative.   Gastrointestinal: Negative.   Genitourinary: Negative.   Musculoskeletal: Positive for joint pain.  Skin: Negative.   Neurological: Negative.   Psychiatric/Behavioral: Negative.     There were no vitals taken for this visit. Physical Exam  Constitutional: She is oriented to person, place, and time. She appears well-developed and well-nourished.  HENT:  Head: Normocephalic and atraumatic.  Eyes: Conjunctivae and EOM are normal. Pupils are equal, round, and reactive to light.  Neck: Normal range of  motion. Neck supple.  Cardiovascular: Normal rate and regular rhythm.   Respiratory: Effort normal and breath sounds normal.  GI: Soft. Bowel sounds are normal.  Musculoskeletal:  General Mental Status - Alert. General Appearance- pleasant. Not in acute distress. Orientation- Oriented X3. Build & Nutrition- Well nourished and Well developed.  Musculoskeletal Upper Extremity Right Upper Extremity: Right Shoulder: Inspection  and Palpation:Tenderness- subacromial space tender to palpation and anterior joint line tender to palpation. no tenderness to palpation of the [facility], no tenderness to palpation of the [place] joint and no tenderness to palpation of the clavicle. Swelling- none. Effusion- none. Sensation- intact to light touch. Skin:Color- no ecchymosis and no erythema. ROM: Internal Rotation:AROM- full. External Rotation:AROM- full and painful. Flexion:AROM- mildly decreased and painful. Glenohumeral Abduction:AROM- mildly decreased and painful. Strength and Tone:Biceps- 5/5. Deltoid- normal. Triceps- 5/5. Abduction- 5/5. Internal Rotation- 5/5. External Rotation- 5/5. Rotator Cuff- normal. Instability- sulcus sign negative. Impingement- impingement sign positive and secondary impingement sign positive. Deformities/Malalignments/Discrepancies- no deformities noted. Special Testing- Speed's test negative.  Neurological: She is alert and oriented to person, place, and time. She has normal reflexes.  Skin: Skin is warm and dry.  Psychiatric: She has a normal mood and affect.    MRI R shoulder images and report reviewed today by Dr. Shelle IronBeane. Supraspinatus tendinosis with small partial thickness 2mm perforation at the critical zone. Severe bicep tendinosis and SLAP tear also noted.  Prior xrays reviewed with no significant decrease in subacromial space and a non-hooked acromion.  Assessment/Plan R shoulder impingement syndrome, partial RCT  Pt with ongoing R shoulder pain, impingement for approx 1.5 years, refractory to steroid injections, HEP, activity modifications, NSAIDs, pain medications, relative rest. We discussed relevant anatomy in detail and reviewed her MRI images and dx, impingement with partial RCT, likely asymptomatic labral tear as the subacromial injection completely relieved her symptoms for 30 minutes with lidocaine. Discussed tx options. Given that she does have a partial  tear on MRI and the last injection was unsuccessful, would not repeat steroid injection at this point. Given her ongoing symptoms and MRI findings, recommend proceeding with right shoulder arthroscopy, SAD, possible mini-open RCR. We discussed the procedure itself as well as risks, complications, alternatives including but not limited to DVT, PE, infx, bleeding, failure of procedure, need for secondary procedure, anesthesia risk, even death. Discussed post-op protocol, possibility of repair and longer recovery period, need for sling, PT, activity restrictions, time out of work. All her questions were answered and she desires to proceed. We will proceed accoringly with scheduling. Will switch pain medication to Ultram as needed since she had GI upset with the Norco. Discussed continued activity modifications in the interim to avoid exacerbation. She will follow up 10-14 days post-op for suture removal and will call with any questions or concerns in the interim.  Plan R shoulder arthroscopy, SAD, possible mini-open RCR  Swannie Milius M. for Dr. Shelle IronBeane 03/25/2013, 12:03 PM

## 2013-04-03 ENCOUNTER — Encounter (HOSPITAL_COMMUNITY): Payer: Self-pay | Admitting: Pharmacy Technician

## 2013-04-05 ENCOUNTER — Encounter (HOSPITAL_COMMUNITY): Payer: Self-pay

## 2013-04-05 ENCOUNTER — Encounter (HOSPITAL_COMMUNITY)
Admission: RE | Admit: 2013-04-05 | Discharge: 2013-04-05 | Disposition: A | Discharge status: Home / Self Care | Payer: 59 | Source: Ambulatory Visit | Attending: Specialist | Admitting: Specialist

## 2013-04-05 DIAGNOSIS — Z01812 Encounter for preprocedural laboratory examination: Secondary | ICD-10-CM | POA: Insufficient documentation

## 2013-04-05 HISTORY — DX: Other skin changes: R23.8

## 2013-04-05 HISTORY — DX: Spontaneous ecchymoses: R23.3

## 2013-04-05 LAB — CBC
HCT: 37.9 % (ref 36.0–46.0)
HEMOGLOBIN: 12.9 g/dL (ref 12.0–15.0)
MCH: 31.5 pg (ref 26.0–34.0)
MCHC: 34 g/dL (ref 30.0–36.0)
MCV: 92.7 fL (ref 78.0–100.0)
Platelets: 188 10*3/uL (ref 150–400)
RBC: 4.09 MIL/uL (ref 3.87–5.11)
RDW: 12.5 % (ref 11.5–15.5)
WBC: 7.7 10*3/uL (ref 4.0–10.5)

## 2013-04-05 NOTE — Patient Instructions (Signed)
PER [place] FLU POLICY  - NO VISITORS UNDER 39 YEARS OF AGE.   [facility] SURG[facility]  ON:   Thursday  2/5  REPORT TO  SHORT STAY CENTER AT:  5:30 AM      PHONE # FOR SHORT STAY IS [phone]  DO NOT EAT OR DRINK ANYTHING AFTER MIDNIGHT THE NIGHT BEFORE YOUR SURGERY.  YOU MAY BRUSH YOUR TEETH, RINSE OUT YOUR MOUTH--BUT NO WATER, NO FOOD, NO CHEWING GUM, NO MINTS, NO CANDIES, NO CHEWING TOBACCO.  PLEASE TAKE THE FOLLOWING MEDICATIONS THE AM OF YOUR SURGERY WITH A FEW SIPS OF WATER:  SYNTHROID   DO NOT BRING VALUABLES, MONEY, CREDIT CARDS.  DO NOT WEAR JEWELRY, MAKE-UP, NAIL POLISH AND NO METAL PINS OR CLIPS IN YOUR HAIR. CONTACT LENS, DENTURES / PARTIALS, GLASSES SHOULD NOT BE WORN TO SURGERY AND IN MOST CASES-HEARING AIDS WILL NEED TO BE REMOVED.  BRING YOUR GLASSES CASE, ANY EQUIPMENT NEEDED FOR YOUR CONTACT LENS. FOR PATIENTS ADMITTED TO THE HOSPITAL--CHECK OUT TIME THE DAY OF DISCHARGE IS 11:00 AM.  ALL INPATIENT ROOMS ARE PRIVATE - WITH BATHROOM, TELEPHONE, TELEVISION AND WIFI INTERNET.  IF YOU ARE BEING DISCHARGED THE SAME DAY OF YOUR SURGERY--YOU CAN NOT DRIVE YOURSELF HOME--AND SHOULD NOT GO HOME ALONE BY TAXI OR BUS.  NO DRIVING OR OPERATING MACHINERY, DO NOT MAKE LEGAL DECISIONS FOR 24 HOURS FOLLOWING ANESTHESIA / PAIN MEDICATIONS.  PLEASE MAKE ARRANGEMENTS FOR SOMEONE TO BE WITH YOU AT HOME THE FIRST 24 HOURS AFTER SURGERY. RESPONSIBLE DRIVER'S NAME / PHONE  IF PT GOES HOME SAME DAY OF SURGERY - HER HUSBAND ED WILL BE WITH HER                                                   PLEASE READ OVER ANY  FACT SHEETS THAT YOU WERE GIVEN:  INCENTIVE SPIROMETER INFORMATION.  FAILURE TO FOLLOW THESE INSTRUCTIONS MAY RESULT IN THE CANCELLATION OF YOUR SURGERY. PLEASE BE AWARE THAT YOU MAY NEED ADDITIONAL BLOOD DRAWN DAY OF YOUR SURGERY  PATIENT SIGNATURE_________________________________

## 2013-04-05 NOTE — Pre-Procedure Instructions (Signed)
EKG REPORT IN EPIC FROM 05/11/12.  CXR NOT NEEDED PER ANESTHESIOLOGIST'S GUIDELINES.

## 2013-04-10 NOTE — Anesthesia Preprocedure Evaluation (Addendum)
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 States she has dislocated her jaw in past Dental no notable dental hx. (+) Teeth Intact and Dental Advisory Given   Pulmonary neg pulmonary ROS, former smoker,  breath sounds clear to auscultation  Pulmonary exam normal       Cardiovascular Exercise Tolerance: Good negative cardio ROS  Rhythm:regular Rate:Normal     Neuro/Psych negative neurological ROS  negative psych ROS   GI/Hepatic negative GI ROS, Neg liver ROS,   Endo/Other  Hypothyroidism   Renal/GU negative Renal ROS  negative genitourinary   Musculoskeletal   Abdominal   Peds  Hematology negative hematology ROS (+)   Anesthesia Other Findings   Reproductive/Obstetrics negative OB ROS                          Anesthesia Physical Anesthesia Plan  ASA: II  Anesthesia Plan: General   Post-op Pain Management:    Induction: Intravenous  Airway Management Planned: Oral ETT  Additional Equipment:   Intra-op Plan:   Post-operative Plan: Extubation in OR  Informed Consent: I have reviewed the patients History and Physical, chart, labs and discussed the procedure including the risks, benefits and alternatives for the proposed anesthesia with the patient or authorized representative who has indicated his/her understanding and acceptance.   Dental advisory given  Plan Discussed with: CRNA  Anesthesia Plan Comments:         Anesthesia Quick Evaluation

## 2013-04-11 ENCOUNTER — Ambulatory Visit (HOSPITAL_COMMUNITY): Payer: 59 | Admitting: Anesthesiology

## 2013-04-11 ENCOUNTER — Ambulatory Visit (HOSPITAL_COMMUNITY)
Admission: RE | Admit: 2013-04-11 | Discharge: 2013-04-11 | Disposition: A | Discharge status: Home / Self Care | Payer: 59 | Source: Ambulatory Visit | Attending: Specialist | Admitting: Specialist

## 2013-04-11 ENCOUNTER — Encounter (HOSPITAL_COMMUNITY): Payer: 59 | Admitting: Anesthesiology

## 2013-04-11 ENCOUNTER — Encounter (HOSPITAL_COMMUNITY)
Admission: RE | Disposition: A | Discharge status: Home / Self Care | Payer: Self-pay | Source: Ambulatory Visit | Attending: Specialist

## 2013-04-11 ENCOUNTER — Encounter (HOSPITAL_COMMUNITY): Payer: Self-pay | Admitting: Anesthesiology

## 2013-04-11 DIAGNOSIS — M7540 Impingement syndrome of unspecified shoulder: Secondary | ICD-10-CM

## 2013-04-11 DIAGNOSIS — Z9089 Acquired absence of other organs: Secondary | ICD-10-CM | POA: Insufficient documentation

## 2013-04-11 DIAGNOSIS — M67919 Unspecified disorder of synovium and tendon, unspecified shoulder: Secondary | ICD-10-CM | POA: Insufficient documentation

## 2013-04-11 DIAGNOSIS — M7580 Other shoulder lesions, unspecified shoulder: Secondary | ICD-10-CM

## 2013-04-11 DIAGNOSIS — M25819 Other specified joint disorders, unspecified shoulder: Secondary | ICD-10-CM | POA: Insufficient documentation

## 2013-04-11 DIAGNOSIS — M6688 Spontaneous rupture of other tendons, other: Secondary | ICD-10-CM | POA: Insufficient documentation

## 2013-04-11 DIAGNOSIS — Z87891 Personal history of nicotine dependence: Secondary | ICD-10-CM | POA: Insufficient documentation

## 2013-04-11 DIAGNOSIS — E039 Hypothyroidism, unspecified: Secondary | ICD-10-CM | POA: Insufficient documentation

## 2013-04-11 DIAGNOSIS — M719 Bursopathy, unspecified: Principal | ICD-10-CM | POA: Insufficient documentation

## 2013-04-11 HISTORY — PX: SHOULDER ARTHROSCOPY WITH ROTATOR CUFF REPAIR AND SUBACROMIAL DECOMPRESSION: SHX5686

## 2013-04-11 SURGERY — SHOULDER ARTHROSCOPY WITH ROTATOR CUFF REPAIR AND SUBACROMIAL DECOMPRESSION
Anesthesia: General | Site: Shoulder | Laterality: Right

## 2013-04-11 MED ORDER — MIDAZOLAM HCL 2 MG/2ML IJ SOLN
INTRAMUSCULAR | Status: AC
  Filled 2013-04-11: qty 2

## 2013-04-11 MED ORDER — NEOSTIGMINE METHYLSULFATE 1 MG/ML IJ SOLN
INTRAMUSCULAR | Status: DC | PRN
  Administered 2013-04-11: 3 mg via INTRAVENOUS

## 2013-04-11 MED ORDER — SUFENTANIL CITRATE 50 MCG/ML IV SOLN
INTRAVENOUS | Status: DC | PRN
  Administered 2013-04-11: 15 ug via INTRAVENOUS
  Administered 2013-04-11: 10 ug via INTRAVENOUS

## 2013-04-11 MED ORDER — SUFENTANIL CITRATE 50 MCG/ML IV SOLN
INTRAVENOUS | Status: AC
  Filled 2013-04-11: qty 1

## 2013-04-11 MED ORDER — SODIUM CHLORIDE 0.9 % IJ SOLN
INTRAMUSCULAR | Status: AC
  Filled 2013-04-11: qty 10

## 2013-04-11 MED ORDER — LIDOCAINE HCL (CARDIAC) 20 MG/ML IV SOLN
INTRAVENOUS | Status: AC
  Filled 2013-04-11: qty 5

## 2013-04-11 MED ORDER — CISATRACURIUM BESYLATE 20 MG/10ML IV SOLN
INTRAVENOUS | Status: AC
  Filled 2013-04-11: qty 10

## 2013-04-11 MED ORDER — CEFAZOLIN SODIUM-DEXTROSE 2-3 GM-% IV SOLR
INTRAVENOUS | Status: AC
  Filled 2013-04-11: qty 50

## 2013-04-11 MED ORDER — SUCCINYLCHOLINE CHLORIDE 20 MG/ML IJ SOLN
INTRAMUSCULAR | Status: DC | PRN
  Administered 2013-04-11: 100 mg via INTRAVENOUS

## 2013-04-11 MED ORDER — GLYCOPYRROLATE 0.2 MG/ML IJ SOLN
INTRAMUSCULAR | Status: AC
  Filled 2013-04-11: qty 2

## 2013-04-11 MED ORDER — MIDAZOLAM HCL 5 MG/5ML IJ SOLN
INTRAMUSCULAR | Status: DC | PRN
  Administered 2013-04-11: 2 mg via INTRAVENOUS

## 2013-04-11 MED ORDER — METHOCARBAMOL 500 MG PO TABS: 500.0000 mg | ORAL_TABLET | Freq: Three times a day (TID) | ORAL | Status: DC | PRN

## 2013-04-11 MED ORDER — KETAMINE HCL 10 MG/ML IJ SOLN
INTRAMUSCULAR | Status: AC
  Filled 2013-04-11: qty 1

## 2013-04-11 MED ORDER — CEFAZOLIN SODIUM-DEXTROSE 2-3 GM-% IV SOLR
2.0000 g | INTRAVENOUS | Status: AC
  Administered 2013-04-11: 2 g via INTRAVENOUS

## 2013-04-11 MED ORDER — GLYCOPYRROLATE 0.2 MG/ML IJ SOLN
INTRAMUSCULAR | Status: DC | PRN
  Administered 2013-04-11: 0.4 mg via INTRAVENOUS

## 2013-04-11 MED ORDER — KETAMINE HCL 10 MG/ML IJ SOLN
INTRAMUSCULAR | Status: DC | PRN
  Administered 2013-04-11: 30 mg via INTRAVENOUS
  Administered 2013-04-11: 10 mg via INTRAVENOUS

## 2013-04-11 MED ORDER — DEXAMETHASONE SODIUM PHOSPHATE 10 MG/ML IJ SOLN
INTRAMUSCULAR | Status: DC | PRN
  Administered 2013-04-11: 10 mg via INTRAVENOUS

## 2013-04-11 MED ORDER — LIDOCAINE HCL (CARDIAC) 20 MG/ML IV SOLN
INTRAVENOUS | Status: DC | PRN
  Administered 2013-04-11: 100 mg via INTRAVENOUS

## 2013-04-11 MED ORDER — EPHEDRINE SULFATE 50 MG/ML IJ SOLN
INTRAMUSCULAR | Status: AC
  Filled 2013-04-11: qty 1

## 2013-04-11 MED ORDER — HYDROMORPHONE HCL PF 1 MG/ML IJ SOLN
INTRAMUSCULAR | Status: AC
  Filled 2013-04-11: qty 1

## 2013-04-11 MED ORDER — DEXAMETHASONE SODIUM PHOSPHATE 10 MG/ML IJ SOLN
INTRAMUSCULAR | Status: AC
  Filled 2013-04-11: qty 1

## 2013-04-11 MED ORDER — ONDANSETRON HCL 4 MG/2ML IJ SOLN
INTRAMUSCULAR | Status: DC | PRN
  Administered 2013-04-11: 4 mg via INTRAVENOUS

## 2013-04-11 MED ORDER — KETOROLAC TROMETHAMINE 30 MG/ML IJ SOLN
INTRAMUSCULAR | Status: AC
  Filled 2013-04-11: qty 1

## 2013-04-11 MED ORDER — OXYCODONE-ACETAMINOPHEN 5-325 MG PO TABS
1.0000 | ORAL_TABLET | Freq: Once | ORAL | Status: AC
  Administered 2013-04-11: 1 via ORAL
  Filled 2013-04-11: qty 1

## 2013-04-11 MED ORDER — ACETAMINOPHEN 10 MG/ML IV SOLN
1000.0000 mg | Freq: Once | INTRAVENOUS | Status: AC
  Administered 2013-04-11: 1000 mg via INTRAVENOUS
  Filled 2013-04-11: qty 100

## 2013-04-11 MED ORDER — LACTATED RINGERS IV SOLN
INTRAVENOUS | Status: DC
  Administered 2013-04-11 (×2): via INTRAVENOUS

## 2013-04-11 MED ORDER — EPHEDRINE SULFATE 50 MG/ML IJ SOLN
INTRAMUSCULAR | Status: DC | PRN
  Administered 2013-04-11: 10 mg via INTRAVENOUS

## 2013-04-11 MED ORDER — OXYCODONE-ACETAMINOPHEN 5-325 MG PO TABS: 1.0000 | ORAL_TABLET | ORAL | Status: DC | PRN

## 2013-04-11 MED ORDER — BUPIVACAINE-EPINEPHRINE PF 0.5-1:200000 % IJ SOLN
INTRAMUSCULAR | Status: AC
  Filled 2013-04-11: qty 30

## 2013-04-11 MED ORDER — PROPOFOL 10 MG/ML IV BOLUS
INTRAVENOUS | Status: DC | PRN
  Administered 2013-04-11: 170 mg via INTRAVENOUS

## 2013-04-11 MED ORDER — LACTATED RINGERS IR SOLN
Status: DC | PRN
  Administered 2013-04-11 (×2): 3000 mL

## 2013-04-11 MED ORDER — KETOROLAC TROMETHAMINE 10 MG PO TABS: 10.0000 mg | ORAL_TABLET | Freq: Four times a day (QID) | ORAL | Status: DC | PRN

## 2013-04-11 MED ORDER — BUPIVACAINE-EPINEPHRINE 0.5% -1:200000 IJ SOLN
INTRAMUSCULAR | Status: DC | PRN
  Administered 2013-04-11: 20 mL

## 2013-04-11 MED ORDER — EPINEPHRINE HCL 1 MG/ML IJ SOLN
INTRAMUSCULAR | Status: AC
  Filled 2013-04-11: qty 2

## 2013-04-11 MED ORDER — CISATRACURIUM BESYLATE (PF) 10 MG/5ML IV SOLN
INTRAVENOUS | Status: DC | PRN
Start: 2013-04-11 — End: 2013-04-11
  Administered 2013-04-11: 4 mg via INTRAVENOUS

## 2013-04-11 MED ORDER — LACTATED RINGERS IV SOLN: INTRAVENOUS | Status: DC

## 2013-04-11 MED ORDER — EPINEPHRINE HCL 1 MG/ML IJ SOLN
INTRAMUSCULAR | Status: DC | PRN
  Administered 2013-04-11 (×2): 1 mg

## 2013-04-11 MED ORDER — KETOROLAC TROMETHAMINE 30 MG/ML IJ SOLN
INTRAMUSCULAR | Status: DC | PRN
  Administered 2013-04-11: 30 mg via INTRAVENOUS

## 2013-04-11 MED ORDER — HYDROMORPHONE HCL PF 1 MG/ML IJ SOLN
0.2500 mg | INTRAMUSCULAR | Status: DC | PRN
  Administered 2013-04-11 (×4): 0.5 mg via INTRAVENOUS

## 2013-04-11 MED ORDER — ONDANSETRON HCL 4 MG/2ML IJ SOLN
INTRAMUSCULAR | Status: AC
  Filled 2013-04-11: qty 2

## 2013-04-11 MED ORDER — CHLORHEXIDINE GLUCONATE 4 % EX LIQD: 60.0000 mL | Freq: Once | CUTANEOUS | Status: DC

## 2013-04-11 MED ORDER — PROMETHAZINE HCL 25 MG/ML IJ SOLN: 6.2500 mg | INTRAMUSCULAR | Status: DC | PRN

## 2013-04-11 MED ORDER — METHOCARBAMOL 100 MG/ML IJ SOLN
500.0000 mg | Freq: Once | INTRAVENOUS | Status: AC
  Administered 2013-04-11: 500 mg via INTRAVENOUS
  Filled 2013-04-11: qty 5

## 2013-04-11 MED ORDER — PROPOFOL 10 MG/ML IV BOLUS
INTRAVENOUS | Status: AC
  Filled 2013-04-11: qty 20

## 2013-04-11 SURGICAL SUPPLY — 53 items
ANCHOR NEEDLE 9/16 CIR SZ 8 (NEEDLE) IMPLANT
BLADE CUDA SHAVER 3.5 (BLADE) ×3 IMPLANT
BLADE OSCILLATING/SAGITTAL (BLADE)
BLADE SURG SZ11 CARB STEEL (BLADE) ×3 IMPLANT
BLADE SW THK.38XMED LNG THN (BLADE) IMPLANT
BUR OVAL 4.0 (BURR) IMPLANT
CANNULA ACUFO 5X76 (CANNULA) ×3 IMPLANT
CHLORAPREP W/TINT 26ML (MISCELLANEOUS) IMPLANT
CLEANER TIP ELECTROSURG 2X2 (MISCELLANEOUS) ×3 IMPLANT
CLOSURE WOUND 1/2 X4 (GAUZE/BANDAGES/DRESSINGS)
CLOTH 2% CHLOROHEXIDINE 3PK (PERSONAL CARE ITEMS) ×3 IMPLANT
DECANTER SPIKE VIAL GLASS SM (MISCELLANEOUS) ×3 IMPLANT
DRAPE ORTHO SPLIT 77X108 STRL (DRAPES) ×2
DRAPE POUCH INSTRU U-SHP 10X18 (DRAPES) IMPLANT
DRAPE STERI 35X30 U-POUCH (DRAPES) ×3 IMPLANT
DRAPE SURG ORHT 6 SPLT 77X108 (DRAPES) ×1 IMPLANT
DRSG MEPILEX BORDER 4X8 (GAUZE/BANDAGES/DRESSINGS) ×3 IMPLANT
DURAPREP 26ML APPLICATOR (WOUND CARE) ×3 IMPLANT
ELECT NEEDLE TIP 2.8 STRL (NEEDLE) IMPLANT
ELECT REM PT RETURN 9FT ADLT (ELECTROSURGICAL) ×3
ELECTRODE REM PT RTRN 9FT ADLT (ELECTROSURGICAL) ×1 IMPLANT
GLOVE BIOGEL PI IND STRL 7.5 (GLOVE) ×1 IMPLANT
GLOVE BIOGEL PI IND STRL 8 (GLOVE) ×1 IMPLANT
GLOVE BIOGEL PI INDICATOR 7.5 (GLOVE) ×2
GLOVE BIOGEL PI INDICATOR 8 (GLOVE) ×2
GLOVE SURG SS PI 7.5 STRL IVOR (GLOVE) ×3 IMPLANT
GLOVE SURG SS PI 8.0 STRL IVOR (GLOVE) ×6 IMPLANT
GOWN STRL REUS W/TWL XL LVL3 (GOWN DISPOSABLE) ×6 IMPLANT
KIT BASIN OR (CUSTOM PROCEDURE TRAY) ×3 IMPLANT
KIT POSITION SHOULDER SCHLEI (MISCELLANEOUS) ×3 IMPLANT
MANIFOLD NEPTUNE II (INSTRUMENTS) ×3 IMPLANT
NEEDLE SCORPION MULTI FIRE (NEEDLE) IMPLANT
NEEDLE SPNL 18GX3.5 QUINCKE PK (NEEDLE) ×3 IMPLANT
PACK SHOULDER CUSTOM OPM052 (CUSTOM PROCEDURE TRAY) ×3 IMPLANT
POSITIONER SURGICAL ARM (MISCELLANEOUS) ×3 IMPLANT
SET ARTHROSCOPY TUBING (MISCELLANEOUS) ×2
SET ARTHROSCOPY TUBING LN (MISCELLANEOUS) ×1 IMPLANT
SLING ARM IMMOBILIZER LRG (SOFTGOODS) IMPLANT
SLING ARM IMMOBILIZER MED (SOFTGOODS) IMPLANT
SLING ULTRA II L (ORTHOPEDIC SUPPLIES) IMPLANT
STRIP CLOSURE SKIN 1/2X4 (GAUZE/BANDAGES/DRESSINGS) IMPLANT
SUT ETHIBOND NAB CT1 #1 30IN (SUTURE) IMPLANT
SUT ETHILON 4 0 PS 2 18 (SUTURE) IMPLANT
SUT PROLENE 3 0 PS 2 (SUTURE) IMPLANT
SUT TIGER TAPE 7 IN WHITE (SUTURE) IMPLANT
SUT VIC AB 1-0 CT2 27 (SUTURE) IMPLANT
SUT VIC AB 2-0 CT2 27 (SUTURE) IMPLANT
SUT VICRYL 0-0 OS 2 NEEDLE (SUTURE) IMPLANT
TAPE FIBER 2MM 7IN #2 BLUE (SUTURE) IMPLANT
TOWEL OR 17X26 10 PK STRL BLUE (TOWEL DISPOSABLE) ×3 IMPLANT
TUBING CONNECTING 10 (TUBING) ×2 IMPLANT
TUBING CONNECTING 10' (TUBING) ×1
WAND 90 DEG TURBOVAC W/CORD (SURGICAL WAND) IMPLANT

## 2013-04-11 NOTE — H&P (View-Only) (Signed)
Cassidy Walker is an 38 y.o. female.   Chief Complaint: Right shoulder pain HPI: The patient is a 38 year old female being followed for their right shoulder pain. They are year(s) out from when symptoms began. Symptoms reported today include: pain, catching, popping and pain with weightbearing, while the patient does not report symptoms of: instability. The patient feels that they are doing poorly. Current treatment includes: activity modification and NSAIDs. The following medication has been used for pain control: antiinflammatory medication. The patient presents today following MRI. The patient has reported symptom improvement with: activity modification while they have not gotten any relief of their symptoms with: Cortisone injections (relief from lidocaine only) or NSAIDs (ibuprofen).  She reports the injection from last visit gave relief for about 30 minutes (lidocaine only) but did not help beyond that. She did go on her [place] trip and was able to manage with the pain. She reports she has still been using the arm but has tried to modify activity to avoid overhead use and abduction. Prior injection about a year and a half ago was more helpful. She has been taking the Norco from last visit but it has been causing stomach pain. Reports she has had ultram in the past which was better tolerated. She had Norco previously as well and does not recall this issue. She tried Ibuprofen with no significant relief. She does report popping and catching with use, denies instability.   Past Medical History  Diagnosis Date  . Hypothyroidism     s/p right thyroidectomy  . Anxiety   . Right knee meniscal tear     pain/swelling  . Insomnia   . Complication of anesthesia     jaw tends to lock    Past Surgical History  Procedure Laterality Date  . Shoulder arthroscopy  07-17-09    w/ debridement of labral and partial rotator cuff tear  . Abdominal hysterectomy  2009  . Hysteroscopy w/ endometrial ablation   2009  . Cholecystectomy  2008  . Bladder tack  10 yrs ago  . Thyroidectomy  11 yrs ago    right  . Cesarean section  2007  . Knee arthroscopy  01/14/2011    Procedure: ARTHROSCOPY KNEE;  Surgeon: Javier Docker;  Location: [facility];  Service: Orthopedics;  Laterality: Right;  partial and medial menisectomy WITH DEBRIDEMENT    No family history on file. Social History:  reports that she quit smoking about 5 years ago. Her smoking use included Cigarettes. She smoked 0.00 packs per day. She has never used smokeless tobacco. She reports that she does not drink alcohol or use illicit drugs.  Allergies:  Allergies  Allergen Reactions  . Wellbutrin [Bupropion Hcl] Rash     (Not in a hospital admission)  No results found for this or any previous visit (from the past 48 hour(s)). No results found.  Review of Systems  Constitutional: Negative.   HENT: Negative.   Eyes: Negative.   Respiratory: Negative.   Cardiovascular: Negative.   Gastrointestinal: Negative.   Genitourinary: Negative.   Musculoskeletal: Positive for joint pain.  Skin: Negative.   Neurological: Negative.   Psychiatric/Behavioral: Negative.     There were no vitals taken for this visit. Physical Exam  Constitutional: She is oriented to person, place, and time. She appears well-developed and well-nourished.  HENT:  Head: Normocephalic and atraumatic.  Eyes: Conjunctivae and EOM are normal. Pupils are equal, round, and reactive to light.  Neck: Normal range of  motion. Neck supple.  Cardiovascular: Normal rate and regular rhythm.   Respiratory: Effort normal and breath sounds normal.  GI: Soft. Bowel sounds are normal.  Musculoskeletal:  General Mental Status - Alert. General Appearance- pleasant. Not in acute distress. Orientation- Oriented X3. Build & Nutrition- Well nourished and Well developed.  Musculoskeletal Upper Extremity Right Upper Extremity: Right Shoulder: Inspection  and Palpation:Tenderness- subacromial space tender to palpation and anterior joint line tender to palpation. no tenderness to palpation of the [facility] joint, no tenderness to palpation of the [place] joint and no tenderness to palpation of the clavicle. Swelling- none. Effusion- none. Sensation- intact to light touch. Skin:Color- no ecchymosis and no erythema. ROM: Internal Rotation:AROM- full. External Rotation:AROM- full and painful. Flexion:AROM- mildly decreased and painful. Glenohumeral Abduction:AROM- mildly decreased and painful. Strength and Tone:Biceps- 5/5. Deltoid- normal. Triceps- 5/5. Abduction- 5/5. Internal Rotation- 5/5. External Rotation- 5/5. Rotator Cuff- normal. Instability- sulcus sign negative. Impingement- impingement sign positive and secondary impingement sign positive. Deformities/Malalignments/Discrepancies- no deformities noted. Special Testing- Speed's test negative.  Neurological: She is alert and oriented to person, place, and time. She has normal reflexes.  Skin: Skin is warm and dry.  Psychiatric: She has a normal mood and affect.    MRI R shoulder images and report reviewed today by Dr. Shelle IronBeane. Supraspinatus tendinosis with small partial thickness 2mm perforation at the critical zone. Severe bicep tendinosis and SLAP tear also noted.  Prior xrays reviewed with no significant decrease in subacromial space and a non-hooked acromion.  Assessment/Plan R shoulder impingement syndrome, partial RCT  Pt with ongoing R shoulder pain, impingement for approx 1.5 years, refractory to steroid injections, HEP, activity modifications, NSAIDs, pain medications, relative rest. We discussed relevant anatomy in detail and reviewed her MRI images and dx, impingement with partial RCT, likely asymptomatic labral tear as the subacromial injection completely relieved her symptoms for 30 minutes with lidocaine. Discussed tx options. Given that she does have a partial  tear on MRI and the last injection was unsuccessful, would not repeat steroid injection at this point. Given her ongoing symptoms and MRI findings, recommend proceeding with right shoulder arthroscopy, SAD, possible mini-open RCR. We discussed the procedure itself as well as risks, complications, alternatives including but not limited to DVT, PE, infx, bleeding, failure of procedure, need for secondary procedure, anesthesia risk, even death. Discussed post-op protocol, possibility of repair and longer recovery period, need for sling, PT, activity restrictions, time out of work. All her questions were answered and she desires to proceed. We will proceed accoringly with scheduling. Will switch pain medication to Ultram as needed since she had GI upset with the Norco. Discussed continued activity modifications in the interim to avoid exacerbation. She will follow up 10-14 days post-op for suture removal and will call with any questions or concerns in the interim.  Plan R shoulder arthroscopy, SAD, possible mini-open RCR  BISSELL, JACLYN M. for Dr. Shelle IronBeane 03/25/2013, 12:03 PM

## 2013-04-11 NOTE — Anesthesia Procedure Notes (Signed)
Procedure Name: Intubation Date/Time: 04/11/2013 7:36 AM Performed by: Leroy LibmanEARDON, Cassidy Mccarey L Patient Re-evaluated:Patient Re-evaluated prior to inductionOxygen Delivery Method: Circle system utilized Preoxygenation: Pre-oxygenation with 100% oxygen Intubation Type: IV induction Ventilation: Mask ventilation without difficulty Laryngoscope Size: Miller and 2 Grade View: Grade I Tube type: Oral Tube size: 7.5 mm Number of attempts: 1 Airway Equipment and Method: Stylet Placement Confirmation: ETT inserted through vocal cords under direct vision,  breath sounds checked- equal and bilateral and positive ETCO2 Secured at: 21 cm Tube secured with: Tape Dental Injury: Teeth and Oropharynx as per pre-operative assessment

## 2013-04-11 NOTE — Brief Op Note (Signed)
04/11/2013  8:28 AM  PATIENT:  Cassidy Walker  38 y.o. female  PRE-OPERATIVE DIAGNOSIS:  right shoulder impingement syndrome and partial rotator cuff tear  POST-OPERATIVE DIAGNOSIS:  right shoulder impingement syndrome and partial rotator cuff tear   PROCEDURE:  Procedure(s): RIGHT SHOULDER ARTHROSCOPY WITH SUBACROMIAL DECOMPRESSION, LABRIAL DEBRIDEMENT, ROTATOR CUFF DEBRIDEMENT (Right)  SURGEON:  Surgeon(s) and Role:    * Javier DockerJeffrey C Domnique Vantine, MD - Primary  PHYSICIAN ASSISTANT:   ASSISTANTS: Bissell  B ANESTHESIA:   general  EBL:  Total I/O In: 1000 [I.V.:1000] Out: -   BLOOD ADMINISTERED:none  DRAINS: none   LOCAL MEDICATIONS USED:  MARCAINE     SPECIMEN:  No Specimen  DISPOSITION OF SPECIMEN:  N/A  COUNTS:  YES  TOURNIQUET:  * No tourniquets in log *  DICTATION: .Other Dictation: Dictation Number [id number]  PLAN OF CARE: Discharge to home after PACU  PATIENT DISPOSITION:  PACU - hemodynamically stable.   Delay start of Pharmacological VTE agent (>24hrs) due to surgical blood loss or risk of bleeding: no

## 2013-04-11 NOTE — Anesthesia Postprocedure Evaluation (Signed)
Anesthesia Post Note  Patient: Cassidy Walker  Procedure(s) Performed: Procedure(s) (LRB): RIGHT SHOULDER ARTHROSCOPY WITH SUBACROMIAL DECOMPRESSION, LABRIAL DEBRIDEMENT, ROTATOR CUFF DEBRIDEMENT (Right)  Anesthesia type: General  Patient location: PACU  Post pain: Pain level controlled  Post assessment: Post-op Vital signs reviewed  Last Vitals:  Filed Vitals:   04/11/13 1056  BP: 98/55  Pulse: 81  Temp:   Resp: 16    Post vital signs: Reviewed  Level of consciousness: sedated  Complications: No apparent anesthesia complications

## 2013-04-11 NOTE — Discharge Instructions (Signed)
SHOULDER ARTHROSCOPY POSTOPERATIVE INSTRUCTIONS FOR DR. BEANE ? ?1.  Ice pack on shoulder 3-4 times per day. ? ?2.  May remove bandages in 72 hours and apply band-aids to sutures. ? ?3,  May get out of sling in AM and start gentle pendulum exercises.  You are encouraged       to move the elbow, wrist and hand. ? ?4.  Elevate operative shoulder and elbow on pillow. ? ?5.  Exercise your fingers to help reduce swelling. ? ?6.  Report to your doctor should any of the following situations occur: ? ? -Swelling of your fingers. ? -Inability to wiggle your fingers. ? -Coldness, turning pale or blueness of your fingers. ? -Loss of sensation, numbness or tingling of your fingers. ? -Unusual small or odor from under dressing. ? -Excessive bleeding or drainage from the surgical site(s). ? -Severe pain which is not relieved by the pain medication your doctor prescribed                for you. ? ?7.  Call the office to schedule and appointment for 10-14 days . ?8.  Take one aspirin per day 325mg with a meal if not on another blood thinner or allergic. ? ?Patient Signature:  ________________________________________________________ ? ?Nurse's Signature:  ________________________________________________________  ?

## 2013-04-11 NOTE — Preoperative (Signed)
Beta Blockers   Reason not to administer Beta Blockers:Not Applicable 

## 2013-04-11 NOTE — Transfer of Care (Signed)
Immediate Anesthesia Transfer of Care Note  Patient: Cassidy Walker  Procedure(s) Performed: Procedure(s): RIGHT SHOULDER ARTHROSCOPY WITH SUBACROMIAL DECOMPRESSION, LABRIAL DEBRIDEMENT, ROTATOR CUFF DEBRIDEMENT (Right)  Patient Location: PACU  Anesthesia Type:General  Level of Consciousness: sedated  Airway & Oxygen Therapy: Patient Spontanous Breathing and Patient connected to face mask oxygen  Post-op Assessment: Report given to PACU RN and Post -op Vital signs reviewed and stable  Post vital signs: Reviewed and stable  Complications: No apparent anesthesia complications

## 2013-04-11 NOTE — Interval H&P Note (Signed)
History and Physical Interval Note:  04/11/2013 7:27 AM  Cassidy Walker  has presented today for surgery, with the diagnosis of right shoulder impingement syndrone and partial rotator cuff tear  The various methods of treatment have been discussed with the patient and family. After consideration of risks, benefits and other options for treatment, the patient has consented to  Procedure(s): RIGHT SHOULDER ARTHROSCOPY WITH SUBACROMIAL DECOMPRESSION AND POSSIBLE MINI OPEN ROTATOR CUFF REPAIR (Right) as a surgical intervention .  The patient's history has been reviewed, patient examined, no change in status, stable for surgery.  I have reviewed the patient's chart and labs.  Questions were answered to the patient's satisfaction.     Evelena Masci C

## 2013-04-12 ENCOUNTER — Encounter (HOSPITAL_COMMUNITY): Payer: Self-pay | Admitting: Specialist

## 2013-04-12 NOTE — Op Note (Signed)
NAMTama Walker:  Lust, Cassidy Walker               ACCOUNT NO.:  [id number]  MEDICAL RECORD NO.:  [id number]  LOCATION:  [facility]                         FACILITY:  [facility]  PHYSICIAN:  Jene EveryJeffrey Kamarie Veno, M.D.    DATE OF BIRTH:  01/22/1976  DATE OF PROCEDURE:  04/11/2013 DATE OF DISCHARGE:  04/11/2013                              OPERATIVE REPORT   PREOPERATIVE DIAGNOSIS:  Impingement syndrome, right shoulder, partial tear rotator cuff, bicipital tendonitis, labral tear.  POSTOPERATIVE DIAGNOSIS:  Impingement syndrome, right shoulder, partial tear rotator cuff, bicipital tendonitis, labral tear.  PROCEDURE PERFORMED: 1. Exam under anesthesia. 2. Right shoulder arthroscopy. 3. Debridement of anterior, superior, and posterior labrum. 4. Debridement of partial tear of the rotator cuff. 5. Subacromial decompression and bursectomy.  ANESTHESIA:  General.  ASSISTANT:  Cassidy PocheJacqueline Bissell, PA.  HISTORY:  This is a pleasant 38 year old female with shoulder pain, popping, locking, and labral tearing, partial tear of the rotator cuff, bicipital tendonitis, and mainly had impingement-type pain with positive secondary impingement sign and responsive impingement test.  MRI indicating labral tearing, bicipital tendinitis, and partial tear of the rotator cuff 2 mm.  She is indicated for subacromial bursectomy evaluation of the rotator cuff, possible rotator cuff repair, debridement of labrum.  Risk and benefits were discussed including bleeding, infection, damage to vascular structures, no change in symptoms, worsening symptoms, DVT, PE, and anesthetic complications, etc.  TECHNIQUE:  With the patient in supine beach-chair position after induction of adequate general anesthesia, 2 g Kefzol, in the beach chair position, the right shoulder was examined under anesthesia.  She had full range without contracture.  No evidence of sulcus sign or instability.  After the right shoulder was prepped and draped in  usual sterile fashion, the surgical marker was utilized to delineate the acromion, AC joint, and coracoid.  A standard posterolateral and anterolateral portals were utilized with an incision with a #11 blade through the skin only.  With the arm in the 70/30 position, gentle traction applied, we advanced the arthroscopic cannula into the glenohumeral joint in line with coracoid penetrating atraumatically. Irrigant was utilized to insufflate the joint and lavaged the joint at 65 mmHg.  There was extensive tearing of the labrum in the anterior, posterior, and superior.  There was a variant of the glenohumeral ligament anteriorly.  There was some fraying on the undersurface of the rotator cuff and biceps tendon.  This labral tearing appeared to be pathologic.  I fashioned in the anterior portal therefore with incision through the skin only at a point between the coracoid and the anterolateral aspect of the acromion after localizing with an 18-gauge needle just beneath the biceps tendon and again through the skin only with a #11 blade.  I then advanced the blunt cannula penetrating the capsule just beneath the biceps tendon.  I then introduced a shaver and performed a shaving of the labrum to a stable base.  There was still attachment to the superior glenoid.  It was not a type 2.  There was some extension into the biceps as well.  Any portion of the tear that could be caught within normal motion of the shoulder was debrided preserving the biceps.  Some fraying  of the anterior labrum as well, this was shaved.  I did shave the undersurface of the rotator cuff. Some minor fraying of the biceps tendon that was shaved.  We then examined the shoulder.  The biceps was in its groove.  I was unable to sublux it medially.  It did not appear to be a significant full- thickness tear of the rotator cuff either.  We ranged the shoulder and did not appear to be any further pathology amenable to  arthroscopic intervention.  Again, detached labrum noted.  I, therefore, redirected the camera in the subacromial space using the anterolateral portal triangulating and then insufflating with 65 mmHg.  Hypertrophic bursa was noted,  and I introduced the shaver and performed a full bursectomy. Felt there was ample subacromial space.  We did not perform an acromioplasty.  Therefore, there was ample space.  There was not hypertrophic thickening of the CA ligament either.  After a full bursectomy, we meticulously and carefully inspected the cuff anteriorly and posteriorly in the area where the undersurface of the articular surface tear was and there was no evidence of a full-thickness tear.  We felt this was minor partial tear and not requiring surgical repair. Then we spent considerable time visualizing this and probing this. Next, after looking posteriorly, laterally, anteriorly, and superiorly, no further pathology amenable to arthroscopic intervention, and I therefore removed all instrumentation.  Portals were closed with 4-0 nylon simple sutures.  Then 0.25% Marcaine with epinephrine was infiltrated in the subacromial space.  Wound was dressed sterilely. Placed in a sling and extubated without difficulty, and transported to the recovery room in satisfactory condition.  The patient tolerated the procedure well.  No complications.  Assistant, Cassidy Walker, [place]. was utilized for retraction of the upper extremity, managing the inflow and the outflow of the arthroscopic cannula, and closure.  Minimal blood loss.     Jene Every, M.D.     Cassidy Walker  D:  04/11/2013  T:  04/12/2013  Job:  [id number]

## 2013-05-16 ENCOUNTER — Ambulatory Visit: Payer: 59 | Admitting: Adult Health

## 2013-05-23 ENCOUNTER — Ambulatory Visit: Payer: 59 | Admitting: Adult Health

## 2013-05-31 ENCOUNTER — Ambulatory Visit: Payer: 59 | Admitting: Adult Health

## 2013-11-29 ENCOUNTER — Ambulatory Visit: Payer: Self-pay | Admitting: Orthopedic Surgery

## 2014-01-03 ENCOUNTER — Ambulatory Visit: Payer: Self-pay | Admitting: Orthopedic Surgery

## 2014-01-03 NOTE — H&P (Signed)
Cassidy Walker is an 38 y.o. female.   Chief Complaint: right knee pain HPI: The patient is a 38 year old female who presents today for follow up of their shoulder. The patient is being followed for their right shoulder pain, and right knee pain. Symptoms reported today include: pain. The patient feels that they are doing poorly. Current treatment includes: activity modification and pain medications. The following medication has been used for pain control: Ultram. The patient presents today following MRI (of the knee and the shoulder).  Cassidy Walker follows up for the right knee and shoulder. She continues to work 6 days a week, 12 hours at a time. Pain with overhead activity with her shoulder, locking and popping and giving way of the knee and swelling.  No past medical history on file.  No past surgical history on file.  No family history on file. Social History:  has no tobacco, alcohol, and drug history on file.  Allergies: Allergies not on file   (Not in a hospital admission)  No results found for this or any previous visit (from the past 48 hour(s)). No results found.  Review of Systems  Constitutional: Negative.   HENT: Negative.   Eyes: Negative.   Respiratory: Negative.   Cardiovascular: Negative.   Gastrointestinal: Negative.   Genitourinary: Negative.   Musculoskeletal: Positive for joint pain.  Skin: Negative.   Neurological: Negative.   Psychiatric/Behavioral: Negative.     There were no vitals taken for this visit. Physical Exam  Constitutional: She is oriented to person, place, and time. She appears well-developed and well-nourished.  HENT:  Head: Normocephalic and atraumatic.  Eyes: Conjunctivae and EOM are normal. Pupils are equal, round, and reactive to light.  Neck: Normal range of motion. Neck supple.  Cardiovascular: Normal rate and regular rhythm.   Respiratory: Effort normal and breath sounds normal.  GI: Soft. Bowel sounds are normal.   Musculoskeletal:  Knee is tender in the medial joint line. Some tenderness posteriorly. Positive McMurray. Mild patellofemoral crepitus.  Knee exam on inspection reveals no evidence of soft tissue swelling, ecchymosis, deformity or erythema. On palpation there is no tenderness in the lateral joint line. Nontender over the fibular head or the peroneal nerve. Nontender over the quadriceps insertion of the patellar ligament insertion. The range of motion is full. Provocative maneuvers reveals a negative Lachman, negative anterior and posterior drawer. No instability is noted with varus and valgus stressing at 0 or 30 degrees. On manual motor test the quadriceps and hamstrings are 5/5. Sensory exam is intact to light touch.  Neurological: She is alert and oriented to person, place, and time. She has normal reflexes.  Skin: Skin is warm and dry.  Psychiatric: She has a normal mood and affect.   MRI demonstrates a medial meniscus tear. Re-tear could not be excluded. 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   Assessment/Plan R knee medial meniscus tear, chondromalacia Medial meniscus tear, history of partial meniscectomy in the past, baker's cyst, patellofemoral chondromalacia, possible medial plica.  I do feel the knee is related to the meniscus with mechanical symptoms refractory to conservative treatment. We discussed knee arthroscopy. She has had locking, popping and giving way. She's had cortisone injections, multiple, in the past. Home exercise program. I do feel part of this is related to her load at work. We discussed restrictions. She does not want to lose her job, however.  I had a long discussion with the patient  concerning the risks and benefits of knee arthroscopy including help from the arthroscopic procedure as well as no help from the arthroscopic procedure or worsening of symptoms. Also discussed infection, DVT, PE,  anesthetic complications, etc. Also discussed the possibility of repeat arthroscopic surgery required in the future or total knee replacement. I provided the patient with an illustrated handout and discussed it in detail as well as discussed the postoperative and perioperative courses and return to functional activities including work. Need for postoperative DVT prophylaxis was discussed as well.  Plan Right knee arthroscopy, partial medial meniscectomy  Armas Mcbee M. PA-C for Dr. Shelle IronBeane 01/03/2014, 7:35 AM

## 2014-01-08 ENCOUNTER — Encounter (HOSPITAL_BASED_OUTPATIENT_CLINIC_OR_DEPARTMENT_OTHER): Payer: Self-pay | Admitting: *Deleted

## 2014-01-08 NOTE — Progress Notes (Signed)
SPOKE W/ HUSBAND. NPO AFTER MN WITH EXCEPTION CLEAR LIQUIDS UNTIL 0700 (NO CREAM/ MILK PRODUCTS).  ARRIVE AT 1100. NEEDS HG. MAY TAKE TRAMADOL IF NEEDED AM DOS W/ SIPS OF WATER.

## 2014-01-09 NOTE — Anesthesia Preprocedure Evaluation (Signed)
Anesthesia Evaluation  Patient identified by MRN, date of birth, ID band Patient awake    Reviewed: Allergy & Precautions, H&P , NPO status , Patient's Chart, lab work & pertinent test results  History of Anesthesia Complications Negative for: history of anesthetic complications  Airway Mallampati: II  TM Distance: >3 FB Neck ROM: Full    Dental no notable dental hx.    Pulmonary former smoker,  breath sounds clear to auscultation  Pulmonary exam normal       Cardiovascular Exercise Tolerance: Good Rhythm:Regular Rate:Normal     Neuro/Psych negative neurological ROS  negative psych ROS   GI/Hepatic negative GI ROS, Neg liver ROS,   Endo/Other  Hypothyroidism   Renal/GU negative Renal ROS  negative genitourinary   Musculoskeletal  (+) Arthritis -, Osteoarthritis,    Abdominal   Peds negative pediatric ROS (+)  Hematology negative hematology ROS (+)   Anesthesia Other Findings   Reproductive/Obstetrics negative OB ROS                             Anesthesia Physical Anesthesia Plan  ASA: II  Anesthesia Plan: General   Post-op Pain Management:    Induction: Intravenous  Airway Management Planned: LMA  Additional Equipment:   Intra-op Plan:   Post-operative Plan: Extubation in OR  Informed Consent: I have reviewed the patients History and Physical, chart, labs and discussed the procedure including the risks, benefits and alternatives for the proposed anesthesia with the patient or authorized representative who has indicated his/her understanding and acceptance.   Dental advisory given  Plan Discussed with: CRNA  Anesthesia Plan Comments:         Anesthesia Quick Evaluation

## 2014-01-10 ENCOUNTER — Ambulatory Visit (HOSPITAL_BASED_OUTPATIENT_CLINIC_OR_DEPARTMENT_OTHER)
Admission: RE | Admit: 2014-01-10 | Discharge: 2014-01-10 | Disposition: A | Discharge status: Home / Self Care | Payer: 59 | Source: Ambulatory Visit | Attending: Specialist | Admitting: Specialist

## 2014-01-10 ENCOUNTER — Encounter (HOSPITAL_BASED_OUTPATIENT_CLINIC_OR_DEPARTMENT_OTHER): Payer: Self-pay | Admitting: *Deleted

## 2014-01-10 ENCOUNTER — Ambulatory Visit (HOSPITAL_BASED_OUTPATIENT_CLINIC_OR_DEPARTMENT_OTHER): Payer: 59 | Admitting: Anesthesiology

## 2014-01-10 ENCOUNTER — Encounter (HOSPITAL_BASED_OUTPATIENT_CLINIC_OR_DEPARTMENT_OTHER)
Admission: RE | Disposition: A | Discharge status: Home / Self Care | Payer: Self-pay | Source: Ambulatory Visit | Attending: Specialist

## 2014-01-10 DIAGNOSIS — Z79899 Other long term (current) drug therapy: Secondary | ICD-10-CM | POA: Diagnosis not present

## 2014-01-10 DIAGNOSIS — S83241A Other tear of medial meniscus, current injury, right knee, initial encounter: Secondary | ICD-10-CM | POA: Insufficient documentation

## 2014-01-10 DIAGNOSIS — Z888 Allergy status to other drugs, medicaments and biological substances status: Secondary | ICD-10-CM | POA: Insufficient documentation

## 2014-01-10 DIAGNOSIS — E039 Hypothyroidism, unspecified: Secondary | ICD-10-CM | POA: Insufficient documentation

## 2014-01-10 DIAGNOSIS — X58XXXA Exposure to other specified factors, initial encounter: Secondary | ICD-10-CM | POA: Diagnosis not present

## 2014-01-10 DIAGNOSIS — S83206A Unspecified tear of unspecified meniscus, current injury, right knee, initial encounter: Secondary | ICD-10-CM

## 2014-01-10 DIAGNOSIS — Y929 Unspecified place or not applicable: Secondary | ICD-10-CM | POA: Diagnosis not present

## 2014-01-10 DIAGNOSIS — M179 Osteoarthritis of knee, unspecified: Secondary | ICD-10-CM | POA: Insufficient documentation

## 2014-01-10 DIAGNOSIS — M2660 Temporomandibular joint disorder, unspecified: Secondary | ICD-10-CM | POA: Insufficient documentation

## 2014-01-10 DIAGNOSIS — K5900 Constipation, unspecified: Secondary | ICD-10-CM | POA: Insufficient documentation

## 2014-01-10 DIAGNOSIS — M94261 Chondromalacia, right knee: Secondary | ICD-10-CM | POA: Diagnosis not present

## 2014-01-10 DIAGNOSIS — Z87891 Personal history of nicotine dependence: Secondary | ICD-10-CM | POA: Insufficient documentation

## 2014-01-10 HISTORY — DX: Other constipation: K59.09

## 2014-01-10 HISTORY — DX: Unilateral primary osteoarthritis, unspecified knee: M17.10

## 2014-01-10 HISTORY — DX: Family history of other specified conditions: Z84.89

## 2014-01-10 HISTORY — PX: KNEE ARTHROSCOPY WITH MEDIAL MENISECTOMY: SHX5651

## 2014-01-10 HISTORY — DX: Unspecified temporomandibular joint disorder, unspecified side: M26.609

## 2014-01-10 HISTORY — DX: Osteoarthritis of knee, unspecified: M17.9

## 2014-01-10 LAB — POCT HEMOGLOBIN-HEMACUE: Hemoglobin: 13.1 g/dL (ref 12.0–15.0)

## 2014-01-10 SURGERY — ARTHROSCOPY, KNEE, WITH MEDIAL MENISCECTOMY
Anesthesia: General | Site: Knee | Laterality: Right

## 2014-01-10 MED ORDER — STERILE WATER FOR IRRIGATION IR SOLN
Status: DC | PRN
  Administered 2014-01-10: 500 mL

## 2014-01-10 MED ORDER — HYDROCODONE-ACETAMINOPHEN 7.5-325 MG PO TABS: 40.0000 | ORAL_TABLET | ORAL | Status: DC | PRN

## 2014-01-10 MED ORDER — ACETAMINOPHEN 10 MG/ML IV SOLN
INTRAVENOUS | Status: DC | PRN
  Administered 2014-01-10: 1000 mg via INTRAVENOUS

## 2014-01-10 MED ORDER — CEFAZOLIN SODIUM-DEXTROSE 2-3 GM-% IV SOLR
INTRAVENOUS | Status: AC
  Filled 2014-01-10: qty 50

## 2014-01-10 MED ORDER — KETOROLAC TROMETHAMINE 30 MG/ML IJ SOLN
INTRAMUSCULAR | Status: DC | PRN
  Administered 2014-01-10: 30 mg via INTRAVENOUS

## 2014-01-10 MED ORDER — EPINEPHRINE HCL 1 MG/ML IJ SOLN
INTRAMUSCULAR | Status: DC | PRN
  Administered 2014-01-10: 2 mg

## 2014-01-10 MED ORDER — FENTANYL CITRATE 0.05 MG/ML IJ SOLN
INTRAMUSCULAR | Status: AC
  Filled 2014-01-10: qty 2

## 2014-01-10 MED ORDER — LACTATED RINGERS IV SOLN
INTRAVENOUS | Status: DC
Start: 2014-01-10 — End: 2014-01-10
  Administered 2014-01-10 (×2): via INTRAVENOUS
  Filled 2014-01-10: qty 1000

## 2014-01-10 MED ORDER — PROMETHAZINE HCL 25 MG/ML IJ SOLN
6.2500 mg | INTRAMUSCULAR | Status: DC | PRN
  Filled 2014-01-10: qty 1

## 2014-01-10 MED ORDER — LIDOCAINE HCL (CARDIAC) 20 MG/ML IV SOLN
INTRAVENOUS | Status: DC | PRN
  Administered 2014-01-10: 80 mg via INTRAVENOUS

## 2014-01-10 MED ORDER — PROPOFOL INFUSION 10 MG/ML OPTIME
INTRAVENOUS | Status: DC | PRN
  Administered 2014-01-10: 180 mL via INTRAVENOUS
  Administered 2014-01-10: 20 mL via INTRAVENOUS

## 2014-01-10 MED ORDER — HYDROCODONE-ACETAMINOPHEN 7.5-325 MG PO TABS
1.0000 | ORAL_TABLET | ORAL | Status: DC | PRN
  Administered 2014-01-10: 1 via ORAL
  Filled 2014-01-10: qty 1

## 2014-01-10 MED ORDER — KETOROLAC TROMETHAMINE 10 MG PO TABS: 10.0000 mg | ORAL_TABLET | Freq: Four times a day (QID) | ORAL | Status: DC | PRN

## 2014-01-10 MED ORDER — DEXAMETHASONE SODIUM PHOSPHATE 10 MG/ML IJ SOLN
INTRAMUSCULAR | Status: DC | PRN
  Administered 2014-01-10: 10 mg via INTRAVENOUS

## 2014-01-10 MED ORDER — MIDAZOLAM HCL 5 MG/5ML IJ SOLN
INTRAMUSCULAR | Status: DC | PRN
  Administered 2014-01-10: 2 mg via INTRAVENOUS

## 2014-01-10 MED ORDER — ONDANSETRON HCL 4 MG/2ML IJ SOLN
INTRAMUSCULAR | Status: DC | PRN
  Administered 2014-01-10: 4 mg via INTRAVENOUS

## 2014-01-10 MED ORDER — METHOCARBAMOL 500 MG PO TABS
ORAL_TABLET | ORAL | Status: AC
  Filled 2014-01-10: qty 1

## 2014-01-10 MED ORDER — MIDAZOLAM HCL 2 MG/2ML IJ SOLN
INTRAMUSCULAR | Status: AC
  Filled 2014-01-10: qty 2

## 2014-01-10 MED ORDER — METHOCARBAMOL 500 MG PO TABS
500.0000 mg | ORAL_TABLET | Freq: Three times a day (TID) | ORAL | Status: DC
  Administered 2014-01-10: 500 mg via ORAL
  Filled 2014-01-10: qty 1

## 2014-01-10 MED ORDER — FENTANYL CITRATE 0.05 MG/ML IJ SOLN
INTRAMUSCULAR | Status: AC
  Filled 2014-01-10: qty 4

## 2014-01-10 MED ORDER — BUPIVACAINE-EPINEPHRINE 0.5% -1:200000 IJ SOLN
INTRAMUSCULAR | Status: DC | PRN
  Administered 2014-01-10: 25 mL

## 2014-01-10 MED ORDER — HYDROCODONE-ACETAMINOPHEN 7.5-325 MG PO TABS
ORAL_TABLET | ORAL | Status: AC
  Filled 2014-01-10: qty 1

## 2014-01-10 MED ORDER — FENTANYL CITRATE 0.05 MG/ML IJ SOLN
INTRAMUSCULAR | Status: DC | PRN
  Administered 2014-01-10: 50 ug via INTRAVENOUS
  Administered 2014-01-10: 100 ug via INTRAVENOUS
  Administered 2014-01-10: 50 ug via INTRAVENOUS

## 2014-01-10 MED ORDER — FENTANYL CITRATE 0.05 MG/ML IJ SOLN
25.0000 ug | INTRAMUSCULAR | Status: DC | PRN
  Administered 2014-01-10: 50 ug via INTRAVENOUS
  Administered 2014-01-10: 25 ug via INTRAVENOUS
  Filled 2014-01-10: qty 1

## 2014-01-10 MED ORDER — SODIUM CHLORIDE 0.9 % IR SOLN
Status: DC | PRN
  Administered 2014-01-10: 6000 mL

## 2014-01-10 MED ORDER — CEFAZOLIN SODIUM-DEXTROSE 2-3 GM-% IV SOLR
2.0000 g | INTRAVENOUS | Status: AC
  Administered 2014-01-10: 2 g via INTRAVENOUS
  Filled 2014-01-10: qty 50

## 2014-01-10 MED ORDER — LACTATED RINGERS IV SOLN
INTRAVENOUS | Status: DC
  Filled 2014-01-10: qty 1000

## 2014-01-10 MED ORDER — HYDROCODONE-ACETAMINOPHEN 7.5-325 MG PO TABS: 1.0000 | ORAL_TABLET | ORAL | Status: DC | PRN

## 2014-01-10 MED ORDER — EPHEDRINE SULFATE 50 MG/ML IJ SOLN
INTRAMUSCULAR | Status: DC | PRN
  Administered 2014-01-10: 5 mg via INTRAVENOUS
  Administered 2014-01-10 (×2): 10 mg via INTRAVENOUS

## 2014-01-10 SURGICAL SUPPLY — 40 items
BANDAGE ELASTIC 6 VELCRO ST LF (GAUZE/BANDAGES/DRESSINGS) ×2 IMPLANT
BLADE 4.2CUDA (BLADE) IMPLANT
BLADE CUDA SHAVER 3.5 (BLADE) ×2 IMPLANT
BLADE GREAT WHITE 4.2 (BLADE) IMPLANT
BNDG COHESIVE 6X5 TAN NS LF (GAUZE/BANDAGES/DRESSINGS) ×2 IMPLANT
BOOTIES KNEE HIGH SLOAN (MISCELLANEOUS) ×2 IMPLANT
CANISTER SUCT LVC 12 LTR MEDI- (MISCELLANEOUS) ×2 IMPLANT
CANISTER SUCTION 1200CC (MISCELLANEOUS) IMPLANT
CANISTER SUCTION 2500CC (MISCELLANEOUS) IMPLANT
CANNULA ACUFLEX KIT 5X76 (CANNULA) IMPLANT
CLOTH BEACON ORANGE TIMEOUT ST (SAFETY) ×2 IMPLANT
CUTTER MENISCUS  4.2MM (BLADE)
CUTTER MENISCUS 4.2MM (BLADE) IMPLANT
DRAPE ARTHROSCOPY W/POUCH 114 (DRAPES) ×2 IMPLANT
DURAPREP 26ML APPLICATOR (WOUND CARE) ×2 IMPLANT
ELECT REM PT RETURN 9FT ADLT (ELECTROSURGICAL)
ELECTRODE REM PT RTRN 9FT ADLT (ELECTROSURGICAL) IMPLANT
GLOVE SURG SS PI 8.0 STRL IVOR (GLOVE) ×2 IMPLANT
IV NS IRRIG 3000ML ARTHROMATIC (IV SOLUTION) ×4 IMPLANT
KNEE WRAP E Z 3 GEL PACK (MISCELLANEOUS) ×2 IMPLANT
MINI VAC (SURGICAL WAND) IMPLANT
NDL SAFETY ECLIPSE 18X1.5 (NEEDLE) ×1 IMPLANT
NEEDLE FILTER BLUNT 18X 1/2SAF (NEEDLE) ×1
NEEDLE FILTER BLUNT 18X1 1/2 (NEEDLE) ×1 IMPLANT
NEEDLE HYPO 18GX1.5 SHARP (NEEDLE) ×1
PACK ARTHROSCOPY DSU (CUSTOM PROCEDURE TRAY) ×2 IMPLANT
PACK BASIN DAY SURGERY FS (CUSTOM PROCEDURE TRAY) ×2 IMPLANT
PAD ABD 8X10 STRL (GAUZE/BANDAGES/DRESSINGS) ×2 IMPLANT
PADDING CAST COTTON 6X4 STRL (CAST SUPPLIES) ×2 IMPLANT
RESECTOR FULL RADIUS 4.2MM (BLADE) ×2 IMPLANT
SET ARTHROSCOPY TUBING (MISCELLANEOUS) ×1
SET ARTHROSCOPY TUBING LN (MISCELLANEOUS) ×1 IMPLANT
SPONGE GAUZE 4X4 12PLY (GAUZE/BANDAGES/DRESSINGS) ×2 IMPLANT
SPONGE GAUZE 4X4 12PLY STER LF (GAUZE/BANDAGES/DRESSINGS) ×2 IMPLANT
SUT ETHILON 4 0 PS 2 18 (SUTURE) ×2 IMPLANT
SYR 30ML LL (SYRINGE) ×2 IMPLANT
SYRINGE 10CC LL (SYRINGE) ×2 IMPLANT
TOWEL OR 17X24 6PK STRL BLUE (TOWEL DISPOSABLE) ×2 IMPLANT
WAND 90 DEG TURBOVAC W/CORD (SURGICAL WAND) IMPLANT
WATER STERILE IRR 500ML POUR (IV SOLUTION) ×4 IMPLANT

## 2014-01-10 NOTE — H&P (Signed)
Cassidy Walker is an 38 y.o. female.   Chief Complaint: Right knee pain HPI: Right knee sx meniscus tear  Past Medical History  Diagnosis Date  . Hypothyroidism     s/p right thyroidectomy  . Bruises easily   . TMJ (temporomandibular joint disorder)   . Chronic constipation   . Right knee meniscal tear   . 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 Family history of adverse reaction to anesthesia     MOTHER- HARD TO WAKE    Past Surgical History  Procedure Laterality Date  . Shoulder arthroscopy Left 07-17-09    w/ debridement of labral and partial rotator cuff tear  . Abdominal hysterectomy  2009  . Hysteroscopy w/ endometrial ablation  2009  . Cholecystectomy  2008  . Bladder tack  2005  . Thyroidectomy Right 2004 (approx)  . Cesarean section  2007  . Knee arthroscopy  01/14/2011    Procedure: ARTHROSCOPY KNEE;  Surgeon: Javier DockerJeffrey Walker Elliemae Braman;  Location: [facility];  Service: Orthopedics;  Laterality: Right;  partial and medial menisectomy WITH DEBRIDEMENT  . Nasal fracture surgery    . Shoulder arthroscopy with rotator cuff repair and subacromial decompression Right 04/11/2013    Procedure: RIGHT SHOULDER ARTHROSCOPY WITH SUBACROMIAL DECOMPRESSION, LABRIAL DEBRIDEMENT, ROTATOR CUFF DEBRIDEMENT;  Surgeon: Javier DockerJeffrey Walker Sang Blount, MD;  Location: [facility];  Service: Orthopedics;  Laterality: Right;    History reviewed. No pertinent family history. Social History:  reports that she quit smoking about 5 years ago. Her smoking use included Cigarettes. She smoked 0.00 packs per day for 12 years. She has never used smokeless tobacco. She reports that she does not drink alcohol or use illicit drugs.  Allergies:  Allergies  Allergen Reactions  . Wellbutrin [Bupropion Hcl] Rash    Medications Prior to Admission  Medication Sig Dispense Refill  . levothyroxine (SYNTHROID, LEVOTHROID) 150 MCG tablet Take 150 mcg by mouth every evening. TAKES AT 10PM PRIOR TO 3RD WORK  SHIFT    . traMADol (ULTRAM) 50 MG tablet Take by mouth every 6 (six) hours as needed.    . zolpidem (AMBIEN CR) 12.5 MG CR tablet Take 12.5 mg by mouth at bedtime as needed for sleep.     Marland Kitchen. ibuprofen (ADVIL,MOTRIN) 200 MG tablet Take 400 mg by mouth every 6 (six) hours as needed.    . lubiprostone (AMITIZA) 24 MCG capsule Take 24 mcg by mouth 2 (two) times daily as needed for constipation.     . methocarbamol (ROBAXIN) 500 MG tablet Take 1 tablet (500 mg total) by mouth 3 (three) times daily between meals as needed for muscle spasms. 40 tablet 1    Results for orders placed or performed during the hospital encounter of 01/10/14 (from the past 48 hour(s))  Hemoglobin-hemacue, POC     Status: None   Collection Time: 01/10/14 11:43 AM  Result Value Ref Range   Hemoglobin 13.1 12.0 - 15.0 g/dL   No results found.  Review of Systems  Musculoskeletal: Positive for joint pain.  All other systems reviewed and are negative.   Blood pressure 110/64, pulse 78, temperature 98 F (36.7 Walker), temperature source Oral, resp. rate 16, height 5\' 9"  (1.753 m), weight 61.236 kg (135 lb), SpO2 100 %. Physical Exam  Constitutional: She appears well-developed.  HENT:  Head: Normocephalic.  Eyes: Pupils are equal, round, and reactive to light.  Neck: Normal range of motion.  Cardiovascular: Normal rate.   Respiratory: Effort  normal.  GI: Soft.  Musculoskeletal: She exhibits tenderness.  MJLT. NVI. No DVT effusion  Skin: Skin is warm and dry.  Psychiatric: She has a normal mood and affect.   MRI medial meniscus tear right  Assessment/Plan Right knee symptomatic medial meniscus tear. Plan Arthroscopy. Risks discussed.  Cassidy Walker 01/10/2014, 1:37 PM

## 2014-01-10 NOTE — Brief Op Note (Signed)
01/10/2014  2:28 PM  PATIENT:  Cassidy Walker  38 y.o. female  PRE-OPERATIVE DIAGNOSIS:  MEDIAL MENISCUS TEAR,PLICA DJD RIGHT KNEE  POST-OPERATIVE DIAGNOSIS:  MEDIAL MENISCUS TEAR,PLICA DJD RIGHT KNEE  PROCEDURE:  Procedure(s): RIGHT ARTHROSCOPY KNEE WITH PARTIAL MEDIAL MENISCECTOMY AND DEBRIDEMENT (Right)  SURGEON:  Surgeon(s) and Role:    * Javier DockerJeffrey C Marlyss Cissell, MD - Primary  PHYSICIAN ASSISTANT:   ASSISTANTS: none   ANESTHESIA:   general  EBL:  Total I/O In: 1200 [I.V.:1200] Out: -   BLOOD ADMINISTERED:none  DRAINS: none   LOCAL MEDICATIONS USED:  MARCAINE     SPECIMEN:  No Specimen  DISPOSITION OF SPECIMEN:  N/A  COUNTS:  YES  TOURNIQUET:  * No tourniquets in log *  DICTATION: .Other Dictation: Dictation Number [id number]  PLAN OF CARE: Discharge to home after PACU  PATIENT DISPOSITION:  PACU - hemodynamically stable.   Delay start of Pharmacological VTE agent (>24hrs) due to surgical blood loss or risk of bleeding: no

## 2014-01-10 NOTE — Discharge Instructions (Signed)
ARTHROSCOPIC KNEE SURGERY HOME CARE INSTRUCTIONS ° ° °PAIN °You will be expected to have a moderate amount of pain in the affected knee for approximately two weeks.  However, the first two to four days will be the most severe in terms of the pain you will experience.  Prescriptions have been provided for you to take as needed for the pain.  The pain can be markedly reduced by using the ice/compressive bandage given.  Exchange the ice packs whenever they thaw.  During the night, keep the bandage on because it will still provide some compression for the swelling.  Also, keep the leg elevated on pillows above your heart, and this will help alleviate the pain and swelling. ° °MEDICATION °Prescriptions have been provided to take as needed for pain. To prevent blood clots, take Aspirin 325mg daily with a meal if not on a blood thinner and if no history of stomach ulcers. ° °ACTIVITY °It is preferred that you stay on bedrest for approximately 24 hours.  However, you may go to the bathroom with help.  After this, you can start to be up and about progressively more.  Remember that the swelling may still increase after three to four days if you are up and doing too much.  You may put as much weight on the affected leg as pain will allow.  Use your crutches for comfort and safety.  However, as soon as you are able, you may discard the crutches and go without them.  ° °DRESSING °Keep the current dressing as dry as possible.  Two days after your surgery, you may remove the ice/compressive wrap, and surgical dressing.  You may now take a shower, but do not scrub the sounds directly with soap.  Let water rinse over these and gently wipe with your hand.  Reapply band-aids over the puncture wounds and more gauze if needed.  A slight amount of thin drainage can be normal at this time, and do not let it frighten you.  Reapply the ice/compressive wrap.  You may now repeat this every day each time you shower. ° °SYMPTOMS TO REPORT TO  YOUR DOCTOR ° -Extreme pain. ° -Extreme swelling. ° -Temperature above 101 degrees that does not come down with acetaminophen     (Tylenol). ° -Any changes in the feeling, color or movement of your toes. ° -Extreme redness, heat, swelling or drainage at your incision ° °EXERCISE °It is preferred that you begin to exercise on the day of your surgery.  Straight leg raises and short arc quads should be begun the afternoon or evening of surgery and continued until you come back for your follow-up appointment.   Attached is an instruction sheet on how to perform these two simple exercises.  Do these at least three times per day if not more.  You may bend your knee as much as is comfortable.  The puncture wounds may occasionally be slightly uncomfortable with bending of the knee.  Do not let this frighten you.  It is important to keep your knee motion, but do not overdo it.  If you have significant pain, simply do not bend the knee as far.   You will be given more exercises to perform at your first return visit.   ° °RETURN APPOINTMENT °Please make an appointment to be seen by your doctor in 14 days from your surgery. ° °Patient Signature:  ________________________________________________________ ° °Nurse's Signature:  ________________________________________________________ ° ° °Post Anesthesia Home Care Instructions ° °Activity: °Get plenty of   rest for the remainder of the day. A responsible adult should stay with you for 24 hours following the procedure.  °For the next 24 hours, DO NOT: °-Drive a car °-Operate machinery °-Drink alcoholic beverages °-Take any medication unless instructed by your physician °-Make any legal decisions or sign important papers. ° °Meals: °Start with liquid foods such as gelatin or soup. Progress to regular foods as tolerated. Avoid greasy, spicy, heavy foods. If nausea and/or vomiting occur, drink only clear liquids until the nausea and/or vomiting subsides. Call your physician if vomiting  continues. ° °Special Instructions/Symptoms: °Your throat may feel dry or sore from the anesthesia or the breathing tube placed in your throat during surgery. If this causes discomfort, gargle with warm salt water. The discomfort should disappear within 24 hours. ° °

## 2014-01-10 NOTE — Transfer of Care (Signed)
Immediate Anesthesia Transfer of Care Note  Patient: Cassidy Walker  Procedure(s) Performed: Procedure(s): RIGHT ARTHROSCOPY KNEE WITH PARTIAL MEDIAL MENISCECTOMY AND DEBRIDEMENT (Right)  Patient Location: PACU  Anesthesia Type:General  Level of Consciousness: awake, alert , oriented and patient cooperative  Airway & Oxygen Therapy: Patient Spontanous Breathing and Patient connected to nasal cannula oxygen  Post-op Assessment: Report given to PACU RN and Post -op Vital signs reviewed and stable  Post vital signs: Reviewed and stable  Complications: No apparent anesthesia complications

## 2014-01-10 NOTE — Anesthesia Procedure Notes (Signed)
Procedure Name: LMA Insertion Date/Time: 01/10/2014 1:43 PM Performed by: Tyrone NineSAUVE, Loanne Emery F Pre-anesthesia Checklist: Patient identified, Timeout performed, Emergency Drugs available, Suction available and Patient being monitored Patient Re-evaluated:Patient Re-evaluated prior to inductionOxygen Delivery Method: Circle system utilized Preoxygenation: Pre-oxygenation with 100% oxygen Intubation Type: IV induction LMA: LMA inserted LMA Size: 4.0 Number of attempts: 1 Placement Confirmation: positive ETCO2 Tube secured with: Tape Dental Injury: Teeth and Oropharynx as per pre-operative assessment

## 2014-01-10 NOTE — Anesthesia Postprocedure Evaluation (Signed)
  Anesthesia Post-op Note  Patient: Cassidy Walker  Procedure(s) Performed: Procedure(s) (LRB): RIGHT ARTHROSCOPY KNEE WITH PARTIAL MEDIAL MENISCECTOMY AND DEBRIDEMENT (Right)  Patient Location: PACU  Anesthesia Type: General  Level of Consciousness: awake and alert   Airway and Oxygen Therapy: Patient Spontanous Breathing  Post-op Pain: mild  Post-op Assessment: Post-op Vital signs reviewed, Patient's Cardiovascular Status Stable, Respiratory Function Stable, Patent Airway and No signs of Nausea or vomiting  Last Vitals:  Filed Vitals:   01/10/14 1500  BP: 102/44  Pulse: 92  Temp:   Resp: 14    Post-op Vital Signs: stable   Complications: No apparent anesthesia complications

## 2014-01-13 ENCOUNTER — Encounter (HOSPITAL_BASED_OUTPATIENT_CLINIC_OR_DEPARTMENT_OTHER): Payer: Self-pay | Admitting: Specialist

## 2014-01-13 NOTE — Op Note (Signed)
[place] Headings:  [name], Cassidy [name]               ACCOUNT NO.:  [id number]  MEDICAL RECORD NO.:  [id number]  LOCATION:                                 FACILITY:  PHYSICIAN:  Cassidy [name], M.D.    DATE OF BIRTH:  06/22/1975  DATE OF PROCEDURE:  01/10/2014 DATE OF DISCHARGE:  01/10/2014                              OPERATIVE REPORT   PREOPERATIVE DIAGNOSIS:  Medial meniscus tear of right knee, degenerative joint disease.  POSTOPERATIVE DIAGNOSES: 1. Medial meniscus tear of right knee. 2. Chondromalacia of the patella, grade 3.  Chondromalacia of the     medial femoral condyle.  PROCEDURES PERFORMED: 1. Right knee arthroscopy. 2. Chondroplasty of femoral condyle and patella.  ANESTHESIA:  General.  ASSISTANT:  None.  HISTORY:  This is a 38 year old with locking, popping giving way.  MRI indicating meniscal tear indicated for partial meniscectomy due to mechanical symptoms and debridement.  Risks and benefits were discussed including bleeding, infection, damage to neurovascular structures, DVT, PE, anesthetic complications, etc.  TECHNIQUE:  With the patient in supine position, after the induction of adequate general anesthesia and 2 g of Kefzol, the right lower extremity was prepped and draped in the usual sterile fashion.  Lateral parapatellar portal was fashioned with a #11 blade.  Ingress cannula was atraumatically placed.  Irrigant was utilized to insufflate the joint. Under direct visualization, a medial parapatellar portal was fashioned with a #11 blade after localization with 18-gauge needle sparing the medial meniscus.  Note, there was a flap tear displaced into the medial gutter of the junction of the middle and anterior third, reduced it with a probe and resected it with a straight basket, and further contoured it with a 3.5 Cuda shaver to a stable base.  Approximately one-third of the posterior third was excised.  Remnant stable to probe palpation, some grade 3 changes of the  femoral condyle.  Light chondroplasty was performed here with a full-radius resector.  Lateral compartment revealed a radial tear and the lateral meniscus was shaved to a stable base.  Remnant was stable to probe palpation.  Suprapatellar pouch, some grade 3 changes of the patella, normal patellofemoral tracking, light chondroplasty was performed here. Gutters were unremarkable.  Revisited all compartments, no further pathology amenable to arthroscopic intervention.  I, therefore, removed all instrumentation.  Portals were closed with 4-0 nylon simple sutures.  A 0.25% Marcaine with epinephrine was infiltrated in the joint.  Wound was dressed sterilely.  Awoken without difficulty and transported to the recovery room in satisfactory condition.  The patient tolerated the procedure well.  No complications.  No assistant.     Cassidy [name], M.D.     Cordelia PenJB/MEDQ  D:  01/10/2014  T:  01/11/2014  Job:  [id number]

## 2014-06-27 NOTE — Op Note (Signed)
PATIENT NAME:  Cassidy Walker, Cassidy Walker MR#:  [id number] DATE OF BIRTH:  1975-10-23  DATE OF PROCEDURE:  05/24/2012  PREOPERATIVE DIAGNOSIS: Chest wall mass.   POSTOPERATIVE DIAGNOSIS: Chest wall sebaceous cyst.   SURGERY: Excision, chest wall mass.   SURGEON: Dr. Michela PitcherEly.   ANESTHESIA: General.   OPERATIVE PROCEDURE: With the patient in the supine position after induction of appropriate general anesthesia, the patient's chest was prepped with Betadine and draped in sterile towels. A curvilinear incision was made in the angle of her inframammary fold, carried down through the subcutaneous tissue with Bovie electrocautery. The lesion was identified,  dissected free bluntly. It did appear to be a sebaceous cyst. It was removed in total without spilling any of the contents. The area was irrigated. The subcutaneous space was obliterated with 3-0 Vicryl and the skin was closed with running subcuticular suture of 4-0 Vicryl. Benzoin and Steri-Strips were applied. The wounds were dressed sterilely, the patient was returned to the recovery room having tolerated the procedure well.   Sponge, instrument, and needle counts were correct x 2 in the operating room.    ____________________________ Quentin Orealph Walker. Ely III, MD rle:dm D: 05/24/2012 07:50:00 ET T: 05/24/2012 07:58:51 ET JOB#: [id number]  cc: Quentin Orealph Walker. Ely III, MD, <Dictator> Quentin OreALPH Walker ELY MD ELECTRONICALLY SIGNED 05/24/2012 10:41

## 2014-10-14 ENCOUNTER — Emergency Department: Payer: Managed Care, Other (non HMO)

## 2014-10-14 ENCOUNTER — Emergency Department
Admission: EM | Admit: 2014-10-14 | Discharge: 2014-10-14 | Disposition: A | Discharge status: Home / Self Care | Payer: Managed Care, Other (non HMO) | Attending: Emergency Medicine | Admitting: Emergency Medicine

## 2014-10-14 ENCOUNTER — Encounter: Payer: Self-pay | Admitting: Emergency Medicine

## 2014-10-14 DIAGNOSIS — Z3202 Encounter for pregnancy test, result negative: Secondary | ICD-10-CM | POA: Diagnosis not present

## 2014-10-14 DIAGNOSIS — R1031 Right lower quadrant pain: Secondary | ICD-10-CM | POA: Insufficient documentation

## 2014-10-14 DIAGNOSIS — R109 Unspecified abdominal pain: Secondary | ICD-10-CM | POA: Diagnosis present

## 2014-10-14 DIAGNOSIS — R1110 Vomiting, unspecified: Secondary | ICD-10-CM | POA: Insufficient documentation

## 2014-10-14 DIAGNOSIS — Z79899 Other long term (current) drug therapy: Secondary | ICD-10-CM | POA: Insufficient documentation

## 2014-10-14 DIAGNOSIS — R102 Pelvic and perineal pain: Secondary | ICD-10-CM | POA: Diagnosis not present

## 2014-10-14 DIAGNOSIS — Z87891 Personal history of nicotine dependence: Secondary | ICD-10-CM | POA: Diagnosis not present

## 2014-10-14 LAB — COMPREHENSIVE METABOLIC PANEL
ALBUMIN: 4.2 g/dL (ref 3.5–5.0)
ALT: 18 U/L (ref 14–54)
AST: 23 U/L (ref 15–41)
Alkaline Phosphatase: 45 U/L (ref 38–126)
Anion gap: 5 (ref 5–15)
BUN: 11 mg/dL (ref 6–20)
CHLORIDE: 106 mmol/L (ref 101–111)
CO2: 27 mmol/L (ref 22–32)
CREATININE: 0.65 mg/dL (ref 0.44–1.00)
Calcium: 8.9 mg/dL (ref 8.9–10.3)
GFR calc Af Amer: >60 mL/min (ref >60)
GFR calc non Af Amer: >60 mL/min (ref >60)
Glucose, Bld: 108 mg/dL — ABNORMAL HIGH (ref 65–99)
Potassium: 4.1 mmol/L (ref 3.5–5.1)
Sodium: 138 mmol/L (ref 135–145)
TOTAL PROTEIN: 7.2 g/dL (ref 6.5–8.1)
Total Bilirubin: 0.5 mg/dL (ref 0.3–1.2)

## 2014-10-14 LAB — URINALYSIS COMPLETE WITH MICROSCOPIC (ARMC ONLY)
Bilirubin Urine: NEGATIVE
GLUCOSE, UA: NEGATIVE mg/dL
Hgb urine dipstick: NEGATIVE
KETONES UR: NEGATIVE mg/dL
LEUKOCYTES UA: NEGATIVE
Nitrite: NEGATIVE
PROTEIN: NEGATIVE mg/dL
Specific Gravity, Urine: 1.015 (ref 1.005–1.030)
pH: 6 (ref 5.0–8.0)

## 2014-10-14 LAB — LIPASE, BLOOD: Lipase: 26 U/L (ref 22–51)

## 2014-10-14 LAB — CBC
HCT: 38.8 % (ref 35.0–47.0)
HEMOGLOBIN: 13.2 g/dL (ref 12.0–16.0)
MCH: 32.2 pg (ref 26.0–34.0)
MCHC: 33.9 g/dL (ref 32.0–36.0)
MCV: 94.9 fL (ref 80.0–100.0)
PLATELETS: 183 10*3/uL (ref 150–440)
RBC: 4.09 MIL/uL (ref 3.80–5.20)
RDW: 13 % (ref 11.5–14.5)
WBC: 7.8 10*3/uL (ref 3.6–11.0)

## 2014-10-14 LAB — PREGNANCY, URINE: PREG TEST UR: NEGATIVE

## 2014-10-14 MED ORDER — ONDANSETRON HCL 4 MG/2ML IJ SOLN
4.0000 mg | Freq: Once | INTRAMUSCULAR | Status: AC
  Administered 2014-10-14: 4 mg via INTRAVENOUS
  Filled 2014-10-14: qty 2

## 2014-10-14 MED ORDER — IOHEXOL 240 MG/ML SOLN
25.0000 mL | Freq: Once | INTRAMUSCULAR | Status: AC | PRN
  Administered 2014-10-14: 25 mL via ORAL

## 2014-10-14 MED ORDER — MORPHINE SULFATE 4 MG/ML IJ SOLN
4.0000 mg | Freq: Once | INTRAMUSCULAR | Status: AC
  Administered 2014-10-14: 4 mg via INTRAVENOUS
  Filled 2014-10-14: qty 1

## 2014-10-14 MED ORDER — SODIUM CHLORIDE 0.9 % IV BOLUS (SEPSIS)
1000.0000 mL | Freq: Once | INTRAVENOUS | Status: AC
  Administered 2014-10-14: 1000 mL via INTRAVENOUS

## 2014-10-14 MED ORDER — IOHEXOL 350 MG/ML SOLN
100.0000 mL | Freq: Once | INTRAVENOUS | Status: AC | PRN
  Administered 2014-10-14: 100 mL via INTRAVENOUS

## 2014-10-14 NOTE — ED Notes (Signed)
Pt to ed with c/o lower abd pain and lower back pain, vomiting x 1 on sat.  diarrhea today.

## 2014-10-14 NOTE — ED Notes (Addendum)
Pt upset and demanding to have pain medication and something to drink.  Pt states she hurts and needs more pain medication.  Discussed with patient that MD will not order any more pain medication and that I am not able to give her pain medication without a dr's order.  Pt then states that I myself have not done anything for her.  Pt requesting to speak with someone else. Also requesting to have something to drink. Explained to patient that I have to ask the dr if she can have something to drink.  Pt then states that I do not need to ask that I need to go and get her something to drink.  I informed MD and also phoned patient relations to speak with her.

## 2014-10-14 NOTE — ED Notes (Signed)
Ultrasound called said they were with another patient and would come get patient in about 10 minutes.

## 2014-10-14 NOTE — Discharge Instructions (Signed)
Please present to her note a clinic in 24 hours to be reevaluated. Please seek medical attention for any high fevers, chest pain, shortness of breath, change in behavior, persistent vomiting, bloody stool or any other new or concerning symptoms.   Abdominal Pain Many things can cause abdominal pain. Usually, abdominal pain is not caused by a disease and will improve without treatment. It can often be observed and treated at home. Your health care provider will do a physical exam and possibly order blood tests and X-rays to help determine the seriousness of your pain. However, in many cases, more time must pass before a clear cause of the pain can be found. Before that point, your health care provider may not know if you need more testing or further treatment. HOME CARE INSTRUCTIONS  Monitor your abdominal pain for any changes. The following actions may help to alleviate any discomfort you are experiencing:  Only take over-the-counter or prescription medicines as directed by your health care provider.  Do not take laxatives unless directed to do so by your health care provider.  Try a clear liquid diet (broth, tea, or water) as directed by your health care provider. Slowly move to a bland diet as tolerated. SEEK MEDICAL CARE IF:  You have unexplained abdominal pain.  You have abdominal pain associated with nausea or diarrhea.  You have pain when you urinate or have a bowel movement.  You experience abdominal pain that wakes you in the night.  You have abdominal pain that is worsened or improved by eating food.  You have abdominal pain that is worsened with eating fatty foods.  You have a fever. SEEK IMMEDIATE MEDICAL CARE IF:   Your pain does not go away within 2 hours.  You keep throwing up (vomiting).  Your pain is felt only in portions of the abdomen, such as the right side or the left lower portion of the abdomen.  You pass bloody or black tarry stools. MAKE SURE  YOU:  Understand these instructions.   Will watch your condition.   Will get help right away if you are not doing well or get worse.  Document Released: 12/01/2004 Document Revised: 02/26/2013 Document Reviewed: 10/31/2012 [facility] Patient Information 2015 [place], [place]. This information is not intended to replace advice given to you by your health care provider. Make sure you discuss any questions you have with your health care provider.

## 2014-10-14 NOTE — ED Notes (Signed)
Patient transported to CT 

## 2014-10-14 NOTE — ED Notes (Signed)
CT called re: CTAP will bring contrast to room.

## 2014-10-14 NOTE — ED Notes (Signed)
Discussed with patient that MD will go over results of CT scan.  Pt was told by CT it would be an hour before results would be ready which she confirmed with me.  Explained to patient that MD will go over the results once they are back and he is not tied up with another patient.  Warm blanket given at this time.  Also explained to patient that I have asked the dr for pain medication, but he does not want to order any more pain medication.

## 2014-10-14 NOTE — ED Notes (Signed)
Pt returned from CT scan.

## 2014-10-14 NOTE — ED Notes (Addendum)
Was phoned by another staff member while checking in on another patient that the patient's call light had gone off and was asking about when her ultrasound was going to be.

## 2014-10-14 NOTE — ED Notes (Signed)
Lab notified to add urine pregnancy to urine test.

## 2014-10-14 NOTE — ED Provider Notes (Signed)
-----------------------------------------   4:34 PM on 10/14/2014 -----------------------------------------   Blood pressure 93/49, pulse 54, temperature 98.3 F (36.8 C), temperature source Oral, resp. rate 22, height  (1.778 m), weight 135 lb (61.236 kg), last menstrual period 10/13/2005, SpO2 100 %.  Assuming care from Dr. Derrill Kay.  In short, Cassidy Walker is a 39 y.o. female with a chief complaint of Abdominal Pain .  Refer to the original H&P for additional details.  The current plan of care is to follow up on the results of the patient's pelvic ultrasound(s).  Of note, the patient has been very upset when she did not continue receiving IV narcotics after 2 rounds of IV morphine.  Her drug database does indicate that she takes tramadol on a chronic basis.  She has spoken with patient relations and was very difficult and verbally combative with providers and staff.   ----------------------------------------- 5:26 PM on 10/14/2014 -----------------------------------------  The patient's ultrasounds were unremarkable with no acute or abnormal findings.  I reviewed all of her results and her vital signs and been consistent.  I discussed with her the reassuring findings.  She remains very displeased with her care.  Her vital signs are consistently stable upon her departure.  She was asking for something to drink at the time of her departure and she ambulated without any difficulty.  Loleta Rose, MD 10/14/14 2220

## 2014-10-14 NOTE — ED Notes (Signed)
Pt upset that she is supposed to have an ultrasound and it is taking too long for them to come and get her.  Ultrasound called to find out ETA of when they might be taking her to ultrasound.

## 2014-10-14 NOTE — ED Notes (Signed)
In room with Dr. York Cerise and Alfredia Client with patient relations.  Upset with Alfredia Client and asked her to leave.  Dr. York Cerise explained for her to follow up with PCP.  IV removed by myself and paperwork given along with work note. Patient wanted a drink specifically asked for Dr. Reino Kent, told her we didn't have that and gave her choices.  Pt said "forget it, I'll get something out in the lobby"

## 2014-10-14 NOTE — ED Notes (Signed)
Informed Cassidy Walker with Pt relations to relay message to patient since she was in the room talking to the patient about her c/os that ultrasound would come and get her in about 10 minutes.

## 2014-10-14 NOTE — ED Notes (Signed)
CT notified that patient is ready to be taken to CT.

## 2014-10-14 NOTE — ED Notes (Signed)
Patient transported to Ultrasound 

## 2014-10-14 NOTE — ED Provider Notes (Signed)
[facility] Emergency Department Provider Note   ____________________________________________  Time seen: 0930  I have reviewed the triage vital signs and the nursing notes.   HISTORY  Chief Complaint Abdominal Pain   History limited by: Not Limited   HPI Cassidy Walker is a 39 y.o. female who presents to the emergency department today because of concerns for abdominal pain. Patient states that she first noticed some abdominal bloating and increased gas 3 days ago. She then developed pain and discomfort. She states it is located in the right flank and right side of her abdomen. It has been accompanied by emesis. She denies any diarrhea. She denies any fevers. Denies similar symptoms in the past.     Past Medical History  Diagnosis Date  . Hypothyroidism     s/p right thyroidectomy  . Bruises easily   . TMJ (temporomandibular joint disorder)   . Chronic constipation   . Right knee meniscal tear   . 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 Family history of adverse reaction to anesthesia     MOTHER- HARD TO WAKE    Patient Active Problem List   Diagnosis Date Noted  . Shoulder impingement syndrome 04/11/2013  . Tear of meniscus of right knee 01/14/2011    Past Surgical History  Procedure Laterality Date  . Shoulder arthroscopy Left 07-17-09    w/ debridement of labral and partial rotator cuff tear  . Abdominal hysterectomy  2009  . Hysteroscopy w/ endometrial ablation  2009  . Cholecystectomy  2008  . Bladder tack  2005  . Thyroidectomy Right 2004 (approx)  . Cesarean section  2007  . Knee arthroscopy  01/14/2011    Procedure: ARTHROSCOPY KNEE;  Surgeon: Javier Docker;  Location: [facility];  Service: Orthopedics;  Laterality: Right;  partial and medial menisectomy WITH DEBRIDEMENT  . Nasal fracture surgery    . Shoulder arthroscopy with rotator cuff repair and subacromial decompression Right 04/11/2013     Procedure: RIGHT SHOULDER ARTHROSCOPY WITH SUBACROMIAL DECOMPRESSION, LABRIAL DEBRIDEMENT, ROTATOR CUFF DEBRIDEMENT;  Surgeon: Javier Docker, MD;  Location: [facility];  Service: Orthopedics;  Laterality: Right;  . Knee arthroscopy with medial menisectomy Right 01/10/2014    Procedure: RIGHT ARTHROSCOPY KNEE WITH PARTIAL MEDIAL MENISCECTOMY AND DEBRIDEMENT;  Surgeon: Javier Docker, MD;  Location: [facility];  Service: Orthopedics;  Laterality: Right;    Current Outpatient Rx  Name  Route  Sig  Dispense  Refill  . HYDROcodone-acetaminophen (NORCO) 7.5-325 MG per tablet   Oral   Take 40 tablets by mouth every 4 (four) hours as needed.   40 tablet   0   . HYDROcodone-acetaminophen (NORCO) 7.5-325 MG per tablet   Oral   Take 1 tablet by mouth every 4 (four) hours as needed for moderate pain.   30 tablet   0   . ketorolac (TORADOL) 10 MG tablet   Oral   Take 1 tablet (10 mg total) by mouth every 6 (six) hours as needed for moderate pain.   20 tablet   0   . levothyroxine (SYNTHROID, LEVOTHROID) 150 MCG tablet   Oral   Take 150 mcg by mouth every evening. TAKES AT 10PM PRIOR TO 3RD WORK SHIFT         . lubiprostone (AMITIZA) 24 MCG capsule   Oral   Take 24 mcg by mouth 2 (two) times daily as needed for constipation.          Marland Kitchen  methocarbamol (ROBAXIN) 500 MG tablet   Oral   Take 1 tablet (500 mg total) by mouth 3 (three) times daily between meals as needed for muscle spasms.   40 tablet   1   . zolpidem (AMBIEN CR) 12.5 MG CR tablet   Oral   Take 12.5 mg by mouth at bedtime as needed for sleep.            Allergies Wellbutrin  History reviewed. No pertinent family history.  Social History History  Substance Use Topics  . Smoking status: Former Smoker -- 12 years    Types: Cigarettes    Quit date: 01/13/2008  . Smokeless tobacco: Never Used  . Alcohol Use: No    Review of Systems  Constitutional: Negative for fever. Cardiovascular: Negative  for chest pain. Respiratory: Negative for shortness of breath. Gastrointestinal: Positive for abdominal pain and vomiting Genitourinary: Negative for dysuria. Musculoskeletal: Negative for back pain. Skin: Negative for rash. Neurological: Negative for headaches, focal weakness or numbness.   10-point ROS otherwise negative.  ____________________________________________   PHYSICAL EXAM:  VITAL SIGNS: ED Triage Vitals  Enc Vitals Group     BP 10/14/14 0918 102/62 mmHg     Pulse Rate 10/14/14 0918 96     Resp 10/14/14 0918 20     Temp 10/14/14 0918 98.3 F (36.8 C)     Temp Source 10/14/14 0918 Oral     SpO2 10/14/14 0918 100 %     Weight 10/14/14 0918 135 lb (61.236 kg)     Height 10/14/14 0918 5\' 10"  (1.778 m)     Head Cir --      Peak Flow --      Pain Score 10/14/14 0919 7   Constitutional: Alert and oriented. Appears in mild discomfort Eyes: Conjunctivae are normal. PERRL. Normal extraocular movements. ENT   Head: Normocephalic and atraumatic.   Nose: No congestion/rhinnorhea.   Mouth/Throat: Mucous membranes are moist.   Neck: No stridor. Hematological/Lymphatic/Immunilogical: No cervical lymphadenopathy. Cardiovascular: Normal rate, regular rhythm.  No murmurs, rubs, or gallops. Respiratory: Normal respiratory effort without tachypnea nor retractions. Breath sounds are clear and equal bilaterally. No wheezes/rales/rhonchi. Gastrointestinal: Soft. Tender to palpation of the right side and right lower quadrant. Some right sided CVA tenderness. No rebound. No guarding. Genitourinary: Deferred Musculoskeletal: Normal range of motion in all extremities. No joint effusions.  No lower extremity tenderness nor edema. Neurologic:  Normal speech and language. No gross focal neurologic deficits are appreciated. Speech is normal.  Skin:  Skin is warm, dry and intact. No rash noted. Psychiatric: Mood and affect are normal. Speech and behavior are normal. Patient  exhibits appropriate insight and judgment.  ____________________________________________    LABS (pertinent positives/negatives)  Labs Reviewed  COMPREHENSIVE METABOLIC PANEL - Abnormal; Notable for the following:    Glucose, Bld 108 (*)    All other components within normal limits  URINALYSIS COMPLETEWITH MICROSCOPIC ([facility] ONLY) - Abnormal; Notable for the following:    Color, Urine YELLOW (*)    APPearance CLEAR (*)    Bacteria, UA RARE (*)    Squamous Epithelial / LPF 0-5 (*)    All other components within normal limits  LIPASE, BLOOD  CBC  PREGNANCY, URINE     ____________________________________________   EKG  None  ____________________________________________    RADIOLOGY  CT abd/pel IMPRESSION: No acute abdominal/ pelvic findings, mass lesions or adenopathy. No renal, ureteral or bladder calculi or mass.  Status postcholecystectomy with mild associated intra and extrahepatic biliary  dilatation.  ____________________________________________   PROCEDURES  Procedure(s) performed: None  Critical Care performed: No  ____________________________________________   INITIAL IMPRESSION / ASSESSMENT AND PLAN / ED COURSE  Pertinent labs & imaging results that were available during my care of the patient were reviewed by me and considered in my medical decision making (see chart for details).  Patient presented to the emergency department today because of abdominal pain. On exam patient with some tenderness in the right upper quadrant. Will check basic blood work and urine. Additionally will get CT abdomen and pelvis to investigate for possible appendicitis.  CT abdomen and pelvis without any concerning findings. Given the patient has persistent pain will plan on obtaining ultrasound to rule out ovarian torsion.  ____________________________________________   FINAL CLINICAL IMPRESSION(S) / ED DIAGNOSES  Final diagnoses:  Abdominal pain in female   Right adnexal tenderness  Right adnexal tenderness     Phineas Semen, MD 10/15/14 1331

## 2014-10-14 NOTE — ED Notes (Signed)
Pt states pain is worse and that the morphine is not helping.  Dr. Derrill Kay notified. No new orders given at this time.  Pt states it will be difficult for her to lay flat since her pain is so bad.  Informed patient that she needs to do the test in order to find out what is wrong. Patient is tearful.

## 2015-06-16 ENCOUNTER — Ambulatory Visit: Payer: Self-pay | Admitting: Orthopedic Surgery

## 2015-06-16 NOTE — H&P (Signed)
Cassidy Walker is an 40 y.o. female.   Chief Complaint: R shoulder pain HPI: The patient is a 40 year old female who presents today for follow up of their shoulder. The patient is being followed for their right shoulder pain. Symptoms reported today include: pain, popping and pain with overhead motions. The patient feels that they are doing poorly and report their pain level to be 6-7 / 10. Current treatment includes: NSAIDs and pain medications. The following medication has been used for pain control: antiinflammatory medication. The patient presents today following MRI. The patient has reported improvement of their symptoms with: conservative measures and NSAIDs. The patient indicates that they have questions or concerns today regarding pain and their progress at this point.  Cassidy Walker follows up and she is having shoulder pain. She is at work where she does a lot of lifting over 30 pounds overhead. She reports she is unable to do so, but she has to have her job.  Past Medical History  Diagnosis Date  . Hypothyroidism     s/p right thyroidectomy  . Bruises easily   . TMJ (temporomandibular joint disorder)   . Chronic constipation   . Right knee meniscal tear   . 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 Family history of adverse reaction to anesthesia     MOTHER- HARD TO WAKE    Past Surgical History  Procedure Laterality Date  . Shoulder arthroscopy Left 07-17-09    w/ debridement of labral and partial rotator cuff tear  . Abdominal hysterectomy  2009  . Hysteroscopy w/ endometrial ablation  2009  . Cholecystectomy  2008  . Bladder tack  2005  . Thyroidectomy Right 2004 (approx)  . Cesarean section  2007  . Knee arthroscopy  01/14/2011    Procedure: ARTHROSCOPY KNEE;  Surgeon: Javier Docker;  Location:  [facility];  Service: Orthopedics;  Laterality: Right;  partial and medial menisectomy WITH DEBRIDEMENT  . Nasal fracture surgery    . Shoulder  arthroscopy with rotator cuff repair and subacromial decompression Right 04/11/2013    Procedure: RIGHT SHOULDER ARTHROSCOPY WITH SUBACROMIAL DECOMPRESSION, LABRIAL DEBRIDEMENT, ROTATOR CUFF DEBRIDEMENT;  Surgeon: Javier Docker, MD;  Location: [facility];  Service: Orthopedics;  Laterality: Right;  . Knee arthroscopy with medial menisectomy Right 01/10/2014    Procedure: RIGHT ARTHROSCOPY KNEE WITH PARTIAL MEDIAL MENISCECTOMY AND DEBRIDEMENT;  Surgeon: Javier Docker, MD;  Location: [facility] ;  Service: Orthopedics;  Laterality: Right;    No family history on file. Social History:  reports that she quit smoking about 7 years ago. Her smoking use included Cigarettes. She quit after 12 years of use. She has never used smokeless tobacco. She reports that she does not drink alcohol or use illicit drugs.  Allergies:  Allergies  Allergen Reactions  . Wellbutrin [Bupropion Hcl] Rash     (Not in a hospital admission)  No results found for this or any previous visit (from the past 48 hour(s)). No results found.  Review of Systems  Constitutional: Negative.   HENT: Negative.   Eyes: Negative.   Respiratory: Negative.   Cardiovascular: Negative.   Gastrointestinal: Negative.   Genitourinary: Negative.   Musculoskeletal: Positive for joint pain.  Skin: Negative.   Neurological: Negative.   Psychiatric/Behavioral: Negative.     Last menstrual period 10/13/2005. Physical Exam  Constitutional: She is oriented to person, place, and time. She appears well-developed.  HENT:  Head: Normocephalic.  Eyes: Pupils are equal, round, and reactive to light.  Neck: Normal range of motion.  Cardiovascular: Normal rate.   Respiratory: Effort normal.  GI: Soft.  Musculoskeletal:  Tender in the anterior subacromial region. Weak in external rotation. Positive impingement sign. Decreased abduction and forward flexion. Nontender over [facility]. Inspection of the cervical spine reveals a normal  lordosis without evidence of paraspinous spasms or soft tissue swelling. Full extension. Extension combined with lateral flexion does not reproduce pain. Negative secondary impingement sign of the shoulders. Negative Tinel's median and ulnar nerves at the elbow. Negative carpal compression test at the wrist. Motor of the upper extremities is 5/5 including biceps, triceps, brachioradialis, wrist flexion, wrist extension, finger flexion, finger extension. Reflexes are normoreflexic. Sensory exam is intact to light touch. There is no Hoffmann sign. Nontender over the thoracic spine.  Neurological: She is alert and oriented to person, place, and time.    Her MRI demonstrates partial tear of the supraspinatus. On review of the coronal image, it is essentially full thickness. Mild AC arthrosis.   Assessment/Plan Rotator cuff arthropathy, essentially a full thickness tear of the rotator cuff, supraspinatus, high risk job.  Discussed shoulder arthroscopy, subacromial decompression, rotator cuff repair. I had a long discussion with the patient concerning the risks and benefits of a rotator cuff repair, including bleeding, infection, prolonged postoperative recovery, which may require 3 to 5 months until maximum medical improvement. Overnight procedure with initiation of early passive range of motion within physical therapy. Avoid any active motion for the first six weeks. This is all in an effort to avoid recurrent tear of the rotator cuff and adhesive capsulitis. Return to work without use of the arm can be obtained following two weeks. However, driving will be a challenge. We also discussed the possibility of requiring implants including bone anchors, as well as an Allograft patch graft if a massive rotator cuff tear is encountered. Removal of any bones for spurs as well as bursitis will be performed during the procedure and also any associated anesthetic complications as well.  In the interim, she may require  be out of work or intermittently out of work. She is to avoid lifting the shoulder and overhead activity. She has an FMLA to fill out. I gave her a note to start that process. We discussed time out of work and return to work, which could be extensive following rotator cuff repair for a physically demanding job. She understands. No recent infections, DVT or MRSA exposure.  Dorothy SparkBISSELL, Veronda Gabor M., PA-C for Dr. Shelle IronBeane 06/16/2015, 8:01 AM

## 2015-06-23 NOTE — Patient Instructions (Addendum)
Cassidy Walker  06/23/2015   Your procedure is scheduled on: 07-02-15  [facility]  [facility] 3rd floor to  Short Stay Center at 5:30 AM.  Call this number if you have problems the morning of surgery [phone]   Remember: ONLY 1 PERSON MAY GO WITH YOU TO SHORT STAY TO GET  READY MORNING OF YOUR SURGERY.  Do not eat food or drink liquids :After Midnight.     Take these medicines the morning of surgery with A SIP OF WATER: Levothyroxine DO NOT TAKE ANY DIABETIC MEDICATIONS DAY OF YOUR SURGERY                               You may not have any metal on your body including hair pins and              piercings  Do not wear jewelry, make-up, lotions, powders or perfumes, deodorant             Do not wear nail polish.  Do not shave  48 hours prior to surgery.           Do not bring valuables to the hospital. [facility] IS NOT             RESPONSIBLE   FOR VALUABLES.  Contacts, dentures or bridgework may not be worn into surgery.  Leave suitcase in the car. After surgery it may be brought to your room.        Special Instructions: coughing and deep breathing exercises, leg exercises              Please read over the following fact sheets you were given: _____________________________________________________________________             [facility] - Preparing [facility] Before surgery, you can play an important role.  Because skin is not sterile, your skin needs to be as free of germs as possible.  You can reduce the number of germs on your skin by washing with CHG (chlorahexidine gluconate) soap before surgery.  CHG is an antiseptic cleaner which kills germs and bonds with the skin to continue killing germs even after washing. Please DO NOT use if you have an allergy to CHG or antibacterial soaps.  If your skin becomes reddened/irritated stop using the CHG and inform your nurse when you arrive at Short Stay. Do not shave  (including legs and underarms) for at least 48 hours prior to the first CHG shower.  You may shave your face/neck. Please follow these instructions carefully:  1.  Shower with CHG Soap the night before surgery and the  morning of Surgery.  2.  If you choose to wash your hair, wash your hair first as usual with your  normal  shampoo.  3.  After you shampoo, rinse your hair and body thoroughly to remove the  shampoo.                           4.  Use CHG as you would any other liquid soap.  You can apply chg directly  to the skin and wash                       Gently with a scrungie  or clean washcloth.  5.  Apply the CHG Soap to your body ONLY FROM THE NECK DOWN.   Do not use on face/ open                           Wound or open sores. Avoid contact with eyes, ears mouth and genitals (private parts).                       Wash face,  Genitals (private parts) with your normal soap.             6.  Wash thoroughly, paying special attention to the area where your surgery  will be performed.  7.  Thoroughly rinse your body with warm water from the neck down.  8.  DO NOT shower/wash with your normal soap after using and rinsing off  the CHG Soap.                9.  Pat yourself dry with a clean towel.            10.  Wear clean pajamas.            11.  Place clean sheets on your bed the night of your first shower and do not  sleep with pets. Day of Surgery : Do not apply any lotions/deodorants the morning of surgery.  Please wear clean clothes to the hospital/surgery center.  FAILURE TO FOLLOW THESE INSTRUCTIONS MAY RESULT IN THE CANCELLATION OF YOUR SURGERY PATIENT SIGNATURE_________________________________  NURSE SIGNATURE__________________________________  ________________________________________________________________________   Adam Phenix  An incentive spirometer is a tool that can help keep your lungs clear and active. This tool measures how well you are filling your lungs with  each breath. Taking long deep breaths may help reverse or decrease the chance of developing breathing (pulmonary) problems (especially infection) following:  A long period of time when you are unable to move or be active. BEFORE THE PROCEDURE   If the spirometer includes an indicator to show your best effort, your nurse or respiratory therapist will set it to a desired goal.  If possible, sit up straight or lean slightly forward. Try not to slouch.  Hold the incentive spirometer in an upright position. INSTRUCTIONS FOR USE  1. Sit on the edge of your bed if possible, or sit up as far as you can in bed or on a chair. 2. Hold the incentive spirometer in an upright position. 3. Breathe out normally. 4. Place the mouthpiece in your mouth and seal your lips tightly around it. 5. Breathe in slowly and as deeply as possible, raising the piston or the ball toward the top of the column. 6. Hold your breath for 3-5 seconds or for as long as possible. Allow the piston or ball to fall to the bottom of the column. 7. Remove the mouthpiece from your mouth and breathe out normally. 8. Rest for a few seconds and repeat Steps 1 through 7 at least 10 times every 1-2 hours when you are awake. Take your time and take a few normal breaths between deep breaths. 9. The spirometer may include an indicator to show your best effort. Use the indicator as a goal to work toward during each repetition. 10. After each set of 10 deep breaths, practice coughing to be sure your lungs are clear. If you have an incision (the cut made at the time of surgery), support your incision when  coughing by placing a pillow or rolled up towels firmly against it. Once you are able to get out of bed, walk around indoors and cough well. You may stop using the incentive spirometer when instructed by your caregiver.  RISKS AND COMPLICATIONS  Take your time so you do not get dizzy or light-headed.  If you are in pain, you may need to take or  ask for pain medication before doing incentive spirometry. It is harder to take a deep breath if you are having pain. AFTER USE  Rest and breathe slowly and easily.  It can be helpful to keep track of a log of your progress. Your caregiver can provide you with a simple table to help with this. If you are using the spirometer at home, follow these instructions: [place] IF:   You are having difficultly using the spirometer.  You have trouble using the spirometer as often as instructed.  Your pain medication is not giving enough relief while using the spirometer.  You develop fever of 100.5 F (38.1 C) or higher. SEEK IMMEDIATE MEDICAL CARE IF:   You cough up bloody sputum that had not been present before.  You develop fever of 102 F (38.9 C) or greater.  You develop worsening pain at or near the incision site. MAKE SURE YOU:   Understand these instructions.  Will watch your condition.  Will get help right away if you are not doing well or get worse. Document Released: 07/04/2006 Document Revised: 05/16/2011 Document Reviewed: 09/04/2006 [facility] Patient Information 2014 [place], [place].   ________________________________________________________________________

## 2015-06-25 ENCOUNTER — Encounter (HOSPITAL_COMMUNITY): Payer: Self-pay

## 2015-06-25 ENCOUNTER — Encounter (HOSPITAL_COMMUNITY)
Admission: RE | Admit: 2015-06-25 | Discharge: 2015-06-25 | Disposition: A | Discharge status: Home / Self Care | Payer: Managed Care, Other (non HMO) | Source: Ambulatory Visit | Attending: Specialist | Admitting: Specialist

## 2015-06-25 DIAGNOSIS — Z01812 Encounter for preprocedural laboratory examination: Secondary | ICD-10-CM | POA: Diagnosis not present

## 2015-06-25 DIAGNOSIS — M75101 Unspecified rotator cuff tear or rupture of right shoulder, not specified as traumatic: Secondary | ICD-10-CM | POA: Diagnosis not present

## 2015-06-25 LAB — CBC
HCT: 34.3 % — ABNORMAL LOW (ref 36.0–46.0)
Hemoglobin: 11.7 g/dL — ABNORMAL LOW (ref 12.0–15.0)
MCH: 31.3 pg (ref 26.0–34.0)
MCHC: 34.1 g/dL (ref 30.0–36.0)
MCV: 91.7 fL (ref 78.0–100.0)
PLATELETS: 207 10*3/uL (ref 150–400)
RBC: 3.74 MIL/uL — AB (ref 3.87–5.11)
RDW: 12.6 % (ref 11.5–15.5)
WBC: 5.6 10*3/uL (ref 4.0–10.5)

## 2015-06-25 NOTE — Progress Notes (Signed)
05-14-12 - EKG - EPIC

## 2015-06-26 LAB — ABO/RH: ABO/RH(D): O POS

## 2015-07-01 NOTE — Anesthesia Preprocedure Evaluation (Addendum)
Anesthesia Evaluation  Patient identified by MRN, date of birth, ID band Patient awake    Reviewed: Allergy & Precautions, H&P , NPO status , Patient's Chart, lab work & pertinent test results  History of Anesthesia Complications Negative for: history of anesthetic complications  Airway Mallampati: II  TM Distance: >3 FB Neck ROM: Full    Dental no notable dental hx. (+) Teeth Intact, Dental Advisory Given   Pulmonary former smoker,    Pulmonary exam normal breath sounds clear to auscultation       Cardiovascular Exercise Tolerance: Good Normal cardiovascular exam Rhythm:Regular Rate:Normal     Neuro/Psych negative neurological ROS  negative psych ROS   GI/Hepatic negative GI ROS, Neg liver ROS,   Endo/Other  Hypothyroidism   Renal/GU negative Renal ROS  negative genitourinary   Musculoskeletal  (+) Arthritis , Osteoarthritis,    Abdominal   Peds negative pediatric ROS (+)  Hematology negative hematology ROS (+)   Anesthesia Other Findings   Reproductive/Obstetrics negative OB ROS                            Anesthesia Physical Anesthesia Plan  ASA: II  Anesthesia Plan: General   Post-op Pain Management: GA combined w/ Regional for post-op pain   Induction: Intravenous  Airway Management Planned: Oral ETT  Additional Equipment:   Intra-op Plan:   Post-operative Plan: Extubation in OR  Informed Consent:   Plan Discussed with: Surgeon  Anesthesia Plan Comments:         Anesthesia Quick Evaluation

## 2015-07-02 ENCOUNTER — Encounter (HOSPITAL_COMMUNITY)
Admission: RE | Disposition: A | Discharge status: Home / Self Care | Payer: Self-pay | Source: Ambulatory Visit | Attending: Specialist

## 2015-07-02 ENCOUNTER — Encounter (HOSPITAL_COMMUNITY): Payer: Self-pay | Admitting: *Deleted

## 2015-07-02 ENCOUNTER — Ambulatory Visit (HOSPITAL_COMMUNITY)
Admission: RE | Admit: 2015-07-02 | Discharge: 2015-07-02 | Disposition: A | Discharge status: Home / Self Care | Payer: Managed Care, Other (non HMO) | Source: Ambulatory Visit | Attending: Specialist | Admitting: Specialist

## 2015-07-02 ENCOUNTER — Ambulatory Visit (HOSPITAL_COMMUNITY): Payer: Managed Care, Other (non HMO) | Admitting: Anesthesiology

## 2015-07-02 DIAGNOSIS — E039 Hypothyroidism, unspecified: Secondary | ICD-10-CM | POA: Insufficient documentation

## 2015-07-02 DIAGNOSIS — Z888 Allergy status to other drugs, medicaments and biological substances status: Secondary | ICD-10-CM | POA: Diagnosis not present

## 2015-07-02 DIAGNOSIS — M75121 Complete rotator cuff tear or rupture of right shoulder, not specified as traumatic: Secondary | ICD-10-CM | POA: Insufficient documentation

## 2015-07-02 DIAGNOSIS — M7501 Adhesive capsulitis of right shoulder: Secondary | ICD-10-CM | POA: Insufficient documentation

## 2015-07-02 DIAGNOSIS — M1990 Unspecified osteoarthritis, unspecified site: Secondary | ICD-10-CM | POA: Insufficient documentation

## 2015-07-02 DIAGNOSIS — M7541 Impingement syndrome of right shoulder: Secondary | ICD-10-CM | POA: Insufficient documentation

## 2015-07-02 DIAGNOSIS — Z87891 Personal history of nicotine dependence: Secondary | ICD-10-CM | POA: Insufficient documentation

## 2015-07-02 HISTORY — PX: SHOULDER ARTHROSCOPY WITH SUBACROMIAL DECOMPRESSION AND OPEN ROTATOR C: SHX5688

## 2015-07-02 LAB — TYPE AND SCREEN
ABO/RH(D): O POS
ANTIBODY SCREEN: NEGATIVE

## 2015-07-02 SURGERY — SHOULDER ARTHROSCOPY WITH SUBACROMIAL DECOMPRESSION AND OPEN ROTATOR CUFF REPAIR, OPEN BICEPS TENDON REPAIR
Anesthesia: General | Site: Shoulder | Laterality: Right

## 2015-07-02 MED ORDER — LACTATED RINGERS IV SOLN
INTRAVENOUS | Status: DC
  Administered 2015-07-02 (×2): via INTRAVENOUS

## 2015-07-02 MED ORDER — EPHEDRINE SULFATE 50 MG/ML IJ SOLN
INTRAMUSCULAR | Status: DC | PRN
  Administered 2015-07-02 (×3): 10 mg via INTRAVENOUS

## 2015-07-02 MED ORDER — DOCUSATE SODIUM 100 MG PO CAPS
100.0000 mg | ORAL_CAPSULE | Freq: Two times a day (BID) | ORAL | Status: AC | PRN
Start: 2015-07-02 — End: ?

## 2015-07-02 MED ORDER — HYDROMORPHONE HCL 1 MG/ML IJ SOLN: 0.2500 mg | INTRAMUSCULAR | Status: DC | PRN

## 2015-07-02 MED ORDER — NEOSTIGMINE METHYLSULFATE 10 MG/10ML IV SOLN
INTRAVENOUS | Status: DC | PRN
  Administered 2015-07-02: 3 mg via INTRAVENOUS

## 2015-07-02 MED ORDER — GLYCOPYRROLATE 0.2 MG/ML IJ SOLN
INTRAMUSCULAR | Status: AC
  Filled 2015-07-02: qty 2

## 2015-07-02 MED ORDER — NEOSTIGMINE METHYLSULFATE 10 MG/10ML IV SOLN
INTRAVENOUS | Status: AC
  Filled 2015-07-02: qty 1

## 2015-07-02 MED ORDER — ROPIVACAINE HCL 5 MG/ML IJ SOLN
INTRAMUSCULAR | Status: DC | PRN
  Administered 2015-07-02: 30 mL via PERINEURAL

## 2015-07-02 MED ORDER — GLYCOPYRROLATE 0.2 MG/ML IJ SOLN
INTRAMUSCULAR | Status: DC | PRN
  Administered 2015-07-02: 0.4 mg via INTRAVENOUS

## 2015-07-02 MED ORDER — KETOROLAC TROMETHAMINE 10 MG PO TABS: 10.0000 mg | ORAL_TABLET | Freq: Four times a day (QID) | ORAL | Status: DC | PRN

## 2015-07-02 MED ORDER — LIDOCAINE HCL (CARDIAC) 20 MG/ML IV SOLN
INTRAVENOUS | Status: DC | PRN
  Administered 2015-07-02: 50 mg via INTRAVENOUS

## 2015-07-02 MED ORDER — FENTANYL CITRATE (PF) 250 MCG/5ML IJ SOLN
INTRAMUSCULAR | Status: AC
  Filled 2015-07-02: qty 5

## 2015-07-02 MED ORDER — LACTATED RINGERS IR SOLN
Status: DC | PRN
Start: 2015-07-02 — End: 2015-07-02
  Administered 2015-07-02: 3000 mL

## 2015-07-02 MED ORDER — EPINEPHRINE HCL 1 MG/ML IJ SOLN
INTRAMUSCULAR | Status: DC | PRN
  Administered 2015-07-02: 1 mg

## 2015-07-02 MED ORDER — PROPOFOL 10 MG/ML IV BOLUS
INTRAVENOUS | Status: DC | PRN
  Administered 2015-07-02: 120 mg via INTRAVENOUS

## 2015-07-02 MED ORDER — PROPOFOL 10 MG/ML IV BOLUS
INTRAVENOUS | Status: AC
  Filled 2015-07-02: qty 40

## 2015-07-02 MED ORDER — ROCURONIUM BROMIDE 100 MG/10ML IV SOLN
INTRAVENOUS | Status: DC | PRN
  Administered 2015-07-02: 40 mg via INTRAVENOUS
  Administered 2015-07-02: 10 mg via INTRAVENOUS

## 2015-07-02 MED ORDER — ROPIVACAINE HCL 5 MG/ML IJ SOLN
INTRAMUSCULAR | Status: AC
  Filled 2015-07-02: qty 30

## 2015-07-02 MED ORDER — LIDOCAINE HCL (CARDIAC) 20 MG/ML IV SOLN
INTRAVENOUS | Status: AC
  Filled 2015-07-02: qty 5

## 2015-07-02 MED ORDER — SODIUM CHLORIDE 0.9 % IR SOLN
Status: AC
  Filled 2015-07-02: qty 1

## 2015-07-02 MED ORDER — ONDANSETRON HCL 4 MG/2ML IJ SOLN
INTRAMUSCULAR | Status: AC
  Filled 2015-07-02: qty 2

## 2015-07-02 MED ORDER — BUPIVACAINE-EPINEPHRINE 0.5% -1:200000 IJ SOLN
INTRAMUSCULAR | Status: DC | PRN
  Administered 2015-07-02: 20 mL

## 2015-07-02 MED ORDER — BUPIVACAINE-EPINEPHRINE (PF) 0.5% -1:200000 IJ SOLN
INTRAMUSCULAR | Status: AC
  Filled 2015-07-02: qty 30

## 2015-07-02 MED ORDER — METHOCARBAMOL 500 MG PO TABS: 500.0000 mg | ORAL_TABLET | Freq: Four times a day (QID) | ORAL | Status: DC | PRN

## 2015-07-02 MED ORDER — CEFAZOLIN SODIUM-DEXTROSE 2-4 GM/100ML-% IV SOLN
INTRAVENOUS | Status: AC
  Filled 2015-07-02: qty 100

## 2015-07-02 MED ORDER — EPINEPHRINE HCL 1 MG/ML IJ SOLN
INTRAMUSCULAR | Status: AC
  Filled 2015-07-02: qty 2

## 2015-07-02 MED ORDER — ONDANSETRON HCL 4 MG/2ML IJ SOLN
INTRAMUSCULAR | Status: DC | PRN
  Administered 2015-07-02: 4 mg via INTRAVENOUS

## 2015-07-02 MED ORDER — PHENYLEPHRINE HCL 10 MG/ML IJ SOLN
INTRAMUSCULAR | Status: DC | PRN
  Administered 2015-07-02 (×3): 80 ug via INTRAVENOUS

## 2015-07-02 MED ORDER — LACTATED RINGERS IV SOLN: INTRAVENOUS | Status: DC

## 2015-07-02 MED ORDER — PHENYLEPHRINE 40 MCG/ML (10ML) SYRINGE FOR IV PUSH (FOR BLOOD PRESSURE SUPPORT)
PREFILLED_SYRINGE | INTRAVENOUS | Status: AC
  Filled 2015-07-02: qty 10

## 2015-07-02 MED ORDER — MIDAZOLAM HCL 2 MG/2ML IJ SOLN
INTRAMUSCULAR | Status: AC
  Filled 2015-07-02: qty 2

## 2015-07-02 MED ORDER — MIDAZOLAM HCL 5 MG/5ML IJ SOLN
INTRAMUSCULAR | Status: DC | PRN
  Administered 2015-07-02 (×2): 1 mg via INTRAVENOUS

## 2015-07-02 MED ORDER — FENTANYL CITRATE (PF) 100 MCG/2ML IJ SOLN
INTRAMUSCULAR | Status: DC | PRN
  Administered 2015-07-02: 100 ug via INTRAVENOUS
  Administered 2015-07-02: 50 ug via INTRAVENOUS

## 2015-07-02 MED ORDER — CEFAZOLIN SODIUM-DEXTROSE 2-4 GM/100ML-% IV SOLN
2.0000 g | INTRAVENOUS | Status: AC
  Administered 2015-07-02: 2 g via INTRAVENOUS

## 2015-07-02 MED ORDER — OXYCODONE-ACETAMINOPHEN 5-325 MG PO TABS: 1.0000 | ORAL_TABLET | ORAL | Status: DC | PRN

## 2015-07-02 SURGICAL SUPPLY — 56 items
ANCHOR NEEDLE 9/16 CIR SZ 8 (NEEDLE) IMPLANT
BLADE CUDA SHAVER 3.5 (BLADE) ×3 IMPLANT
BLADE OSCILLATING/SAGITTAL (BLADE) ×2
BLADE SURG SZ11 CARB STEEL (BLADE) ×3 IMPLANT
BLADE SW THK.38XMED LNG THN (BLADE) ×1 IMPLANT
BUR OVAL 4.0 (BURR) IMPLANT
CANNULA ACUFO 5X76 (CANNULA) ×3 IMPLANT
CHLORAPREP W/TINT 26ML (MISCELLANEOUS) IMPLANT
CLOSURE WOUND 1/2 X4 (GAUZE/BANDAGES/DRESSINGS) ×1
CLOTH 2% CHLOROHEXIDINE 3PK (PERSONAL CARE ITEMS) IMPLANT
DRAPE ORTHO SPLIT 77X108 STRL (DRAPES)
DRAPE POUCH INSTRU U-SHP 10X18 (DRAPES) ×3 IMPLANT
DRAPE STERI 35X30 U-POUCH (DRAPES) ×3 IMPLANT
DRAPE SURG ORHT 6 SPLT 77X108 (DRAPES) IMPLANT
DRSG AQUACEL AG ADV 3.5X 4 (GAUZE/BANDAGES/DRESSINGS) IMPLANT
DRSG AQUACEL AG ADV 3.5X 6 (GAUZE/BANDAGES/DRESSINGS) ×3 IMPLANT
DRSG PAD ABDOMINAL 8X10 ST (GAUZE/BANDAGES/DRESSINGS) IMPLANT
DURAPREP 26ML APPLICATOR (WOUND CARE) ×3 IMPLANT
ELECT NEEDLE TIP 2.8 STRL (NEEDLE) ×3 IMPLANT
ELECT REM PT RETURN 9FT ADLT (ELECTROSURGICAL) ×3
ELECTRODE REM PT RTRN 9FT ADLT (ELECTROSURGICAL) ×1 IMPLANT
GLOVE BIOGEL PI IND STRL 7.0 (GLOVE) ×1 IMPLANT
GLOVE BIOGEL PI INDICATOR 7.0 (GLOVE) ×2
GLOVE SURG SS PI 7.0 STRL IVOR (GLOVE) ×9 IMPLANT
GLOVE SURG SS PI 7.5 STRL IVOR (GLOVE) ×9 IMPLANT
GLOVE SURG SS PI 8.0 STRL IVOR (GLOVE) ×9 IMPLANT
GOWN STRL REUS W/TWL XL LVL3 (GOWN DISPOSABLE) ×12 IMPLANT
KIT BASIN OR (CUSTOM PROCEDURE TRAY) ×3 IMPLANT
KIT POSITION SHOULDER SCHLEI (MISCELLANEOUS) ×3 IMPLANT
MANIFOLD NEPTUNE II (INSTRUMENTS) ×3 IMPLANT
NEEDLE SCORPION MULTI FIRE (NEEDLE) IMPLANT
NEEDLE SPNL 18GX3.5 QUINCKE PK (NEEDLE) ×3 IMPLANT
PACK SHOULDER (CUSTOM PROCEDURE TRAY) ×3 IMPLANT
POSITIONER SURGICAL ARM (MISCELLANEOUS) ×3 IMPLANT
SLING ARM FOAM STRAP MED (SOFTGOODS) ×3 IMPLANT
SLING ARM IMMOBILIZER LRG (SOFTGOODS) IMPLANT
SLING ARM IMMOBILIZER MED (SOFTGOODS) IMPLANT
STRIP CLOSURE SKIN 1/2X4 (GAUZE/BANDAGES/DRESSINGS) ×2 IMPLANT
SUCTION FRAZIER HANDLE 10FR (MISCELLANEOUS) ×2
SUCTION TUBE FRAZIER 10FR DISP (MISCELLANEOUS) ×1 IMPLANT
SUT ETHIBOND NAB CT1 #1 30IN (SUTURE) IMPLANT
SUT ETHILON 4 0 PS 2 18 (SUTURE) ×3 IMPLANT
SUT FIBERWIRE #2 38 T-5 BLUE (SUTURE)
SUT PROLENE 3 0 PS 2 (SUTURE) IMPLANT
SUT TIGER TAPE 7 IN WHITE (SUTURE) IMPLANT
SUT VIC AB 1-0 CT2 27 (SUTURE) IMPLANT
SUT VIC AB 2-0 CT2 27 (SUTURE) IMPLANT
SUT VICRYL 0-0 OS 2 NEEDLE (SUTURE) ×3 IMPLANT
SUTURE FIBERWR #2 38 T-5 BLUE (SUTURE) IMPLANT
TAPE FIBER 2MM 7IN #2 BLUE (SUTURE) IMPLANT
TOWEL OR 17X26 10 PK STRL BLUE (TOWEL DISPOSABLE) ×3 IMPLANT
TOWEL OR NON WOVEN STRL DISP B (DISPOSABLE) IMPLANT
TUBING ARTHRO INFLOW-ONLY STRL (TUBING) ×3 IMPLANT
TUBING CONNECTING 10 (TUBING) ×2 IMPLANT
TUBING CONNECTING 10' (TUBING) ×1
WAND HAND CNTRL MULTIVAC 90 (MISCELLANEOUS) IMPLANT

## 2015-07-02 NOTE — Op Note (Signed)
NAMEALLISEN, PIDGEON NO.:  [id number]  MEDICAL RECORD NO.:  [id number]  LOCATION:  [facility]                         FACILITY:  [facility]  PHYSICIAN:  Jene Every, M.D.    DATE OF BIRTH:  06-05-1975  DATE OF PROCEDURE:  07/02/2015 DATE OF DISCHARGE:                              OPERATIVE REPORT   PREOPERATIVE DIAGNOSES:  Rotator cuff tear, adhesive capsulitis, impingement syndrome, right shoulder.  POSTOPERATIVE DIAGNOSES:  Rotator cuff tear, adhesive capsulitis, impingement syndrome, right shoulder.  PROCEDURE PERFORMED: 1. Exam under anesthesia. 2. Right shoulder arthroscopy, debridement of anterior superior     labrum. 3. Subacromial decompression, mini open rotator cuff repair.  ANESTHESIA:  General.  ASSISTANT:  Lanna Poche, PA.  HISTORY:  A 40 year old, persistent refractory shoulder pain, secondary to repetitive overhead activity.  MRI indicating central full-thickness tear of the rotator cuff.  Supraspinatus footprint, labral tearing, impingement, refractory to conservative treatment, indicated for shoulder arthroscopy.  Exam under anesthesia achieved limited range of motion and compounded adhesive capsulitis.  Discussed mini open repair. Risks and benefits were discussed including bleeding, infection, damage to neurovascular structures, no change in symptoms, worsening symptoms, DVT, PE, anesthetic complications, etc.  TECHNIQUE:  With the patient in supine beach-chair position, after the induction of adequate anesthesia, 2 g Kefzol, right shoulder was prepped and draped in usual sterile fashion.  Prior to that, we examined her under anesthesia and she had full range.  Surgical marker was then utilized to delineate the acromion, AC joint, and coracoid.  Standard posterolateral portal was utilized with the arm in 70/30 position, gentle traction applied to the extremity.  We advanced the arthroscopic camera into the glenohumeral joint  penetrating atraumatically. Irrigated out, 65 mmHg was insufflated, and joint was lavaged.  She had some mild degenerative changes in the glenohumeral joint.  She has a tear of the anterior superior labrum and the supraspinatus just lateral to the bicipital groove noted to be torn.  A Buford-type complex noted as well.  I localized the anterior portal with an 18-gauge needle between the coracoid and the anterolateral aspect of the acromion, advanced it just beneath the biceps tendon.  I made a small incision there and advanced the arthroscopic camera into the glenohumeral joint just underneath the biceps tendon.  I then introduced a 3.5 shaver and performed a debridement of the anterior labrum and lavaged the joint.  I then redirected the camera in the subacromial space triangulating through a separate anterolateral portal into the subacromial space. There was hypertrophic bursa noted, partial bursectomy was performed. Lesion anterolaterally was identified.  We then centered a type 1 acromion at previously released CA ligament.  We then removed all arthroscopic equipments, made a small lymph 1.5 cm incision over the anterolateral aspect of the acromion between the anterolateral heads of the deltoid, identified the raphe, divided it, preserved the attachment of the deltoid.  I made a small spur off the anterolateral aspect of the acromion with a 3 mm Kerrison and residual CA ligament recurrent was excised.  I digitally lysed adhesions in the subacromial space, completed the bursectomy.  We then identified an area lateral to the bicipital groove, hyperemic, small area of  0.5 cm, I incised it, debrided it, and repaired with side-to-side with #1 Vicryl interrupted figure-of-eight sutures, was not detached.  Then I required an anchor. After copious irrigation, inspection of the cuff was unremarkable. There was a small area posteriorly.  I placed the suture in as well side- to-side.  It was  degenerative after debriding.  Again a few mm in length.  Next, I copiously irrigated the wound, repaired the raphe with 1-Vicryl, subcu with 2-0, and skin with Prolene.  Sterile dressing applied.  Placed in a sling, extubated without difficulty, and transported to the recovery room in a satisfactory condition.  The patient tolerated the procedure well.  No complications.  Assistant, Lanna PocheJacqueline Bissell, [place].  Minimal blood loss.     Jene EveryJeffrey Guthrie Lemme, M.D.     Cordelia PenJB/MEDQ  D:  07/02/2015  T:  07/02/2015  Job:  [id number]

## 2015-07-02 NOTE — Brief Op Note (Signed)
07/02/2015  8:45 AM  PATIENT:  Makaiya L Catala  40 y.o. female  PRE-OPERATIVE DIAGNOSIS:  RIGHT SHOULDER ROTATOR TEAR AND IMPINGEMENT   POST-OPERATIVE DIAGNOSIS:  RIGHT SHOULDER ROTATOR TEAR AND IMPINGEMENT   PROCEDURE:  Procedure(s): RIGHT SHOULDER ARTHROSCOPY WITH SUBACROMIAL DECOMPRESSION AND MINI OPEN ROTATOR CUFF REPAIR, EXAM UNDER ANESTHESIA AND POSSIBLE MANIPULATION UNDER ANESTHESIA  (Right)  SURGEON:  Surgeon(s) and Role:    * Jene EveryJeffrey Mairi Stagliano, MD - Primary  PHYSICIAN ASSISTANT:   ASSISTANTS: Bissell   ANESTHESIA:   general  EBL:  Total I/O In: -  Out: 50 [Blood:50]  BLOOD ADMINISTERED:none  DRAINS: none   LOCAL MEDICATIONS USED:  MARCAINE     SPECIMEN:  No Specimen  DISPOSITION OF SPECIMEN:  N/A  COUNTS:  YES  TOURNIQUET:  * No tourniquets in log *  DICTATION: .Other Dictation: Dictation Number [id number]  PLAN OF CARE: Discharge to home after PACU  PATIENT DISPOSITION:  PACU - hemodynamically stable.   Delay start of Pharmacological VTE agent (>24hrs) due to surgical blood loss or risk of bleeding: no

## 2015-07-02 NOTE — Anesthesia Postprocedure Evaluation (Signed)
Anesthesia Post Note  Patient: Cassidy Walker  Procedure(s) Performed: Procedure(s) (LRB): RIGHT SHOULDER ARTHROSCOPY WITH LABRAL DEBRIDEMENTSUBACROMIAL DECOMPRESSION AND MINI OPEN ROTATOR CUFF REPAIR,   (Right)  Patient location during evaluation: PACU Anesthesia Type: General Level of consciousness: awake and alert Pain management: pain level controlled Vital Signs Assessment: post-procedure vital signs reviewed and stable Respiratory status: spontaneous breathing, nonlabored ventilation, respiratory function stable and patient connected to nasal cannula oxygen Cardiovascular status: blood pressure returned to baseline and stable Postop Assessment: no signs of nausea or vomiting Anesthetic complications: no    Last Vitals:  Filed Vitals:   07/02/15 0942 07/02/15 1032  BP: 92/52 100/53  Pulse: 64 60  Temp: 36.8 C 36.8 C  Resp: 12 12    Last Pain:  Filed Vitals:   07/02/15 1032  PainSc: 0-No pain                 Amely Voorheis L

## 2015-07-02 NOTE — Discharge Instructions (Signed)
Aquacel dressing may remain in place until follow up. May shower with aquacel dressing in place. If the dressing becomes saturated or peels off, you may remove it and place a new dressing with gauze and tape which should be kept clean and dry and changed daily. Use sling at times except when exercising or showering No driving for 4-6 weeks No lifting for 6 weeks operative arm Pendulum exercises as instructed. Ok to move wrist, elbow, and hand. See Dr. Shelle IronBeane in 10-14 days. Take one aspirin per day 325 mg with a meal if not on a blood thinner or allergic to aspirin. Pendulum Circular    Bend forward 90 at waist, leaning on table for support. Rock body in a circular pattern to move arm clockwise ____ times then counterclockwise ____ times. Do ____ sessions per day.  Copyright  VHI. All rights reserved. Pendulum Forward/Back    Bend forward 90 at waist, using table for support. Rock body forward and back to swing arm. Repeat ____ times. Do ____ sessions per day.  Copyright  VHI. All rights reserved. Pendulum Side to Side    Bend forward 90 at waist, leaning on table for support. Rock body from side to side and let arm swing freely. Repeat ____ times. Do ____ sessions per day.  Copyright  VHI. All rights reserved.

## 2015-07-02 NOTE — Anesthesia Procedure Notes (Addendum)
Anesthesia Regional Block:  Interscalene brachial plexus block  Pre-Anesthetic Checklist: ,, timeout performed, Correct Patient, Correct Site, Correct Laterality, Correct Procedure, Correct Position, site marked, Risks and benefits discussed,  Surgical consent,  Pre-op evaluation,  At surgeon's request and post-op pain management  Laterality: Right  Prep: chloraprep       Needles:  Injection technique: Single-shot  Needle Type: Stimiplex     Needle Length: 9cm 9 cm Needle Gauge: 21 and 21 G    Additional Needles:  Procedures: ultrasound guided (picture in chart) Interscalene brachial plexus block Narrative:  Start time: 07/02/2015 7:10 AM End time: 07/02/2015 7:20 AM Injection made incrementally with aspirations every 5 mL.  Performed by: Personally  Anesthesiologist: Ronelle NighEWELL, CHARLES  Additional Notes: Patient tolerated the procedure well without complications   Procedure Name: Intubation Performed by: Kizzie FantasiaARVER, Ronan Duecker J Pre-anesthesia Checklist: Patient identified, Emergency Drugs available, Suction available, Patient being monitored and Timeout performed Patient Re-evaluated:Patient Re-evaluated prior to inductionOxygen Delivery Method: Circle system utilized Preoxygenation: Pre-oxygenation with 100% oxygen Intubation Type: IV induction Ventilation: Mask ventilation without difficulty Laryngoscope Size: Mac and 3 Grade View: Grade I Tube type: Oral Tube size: 7.0 mm Number of attempts: 1 Airway Equipment and Method: Stylet Placement Confirmation: ETT inserted through vocal cords under direct vision,  positive ETCO2,  CO2 detector and breath sounds checked- equal and bilateral Secured at: 20 cm Tube secured with: Tape Dental Injury: Teeth and Oropharynx as per pre-operative assessment

## 2015-07-02 NOTE — Transfer of Care (Signed)
Immediate Anesthesia Transfer of Care Note  Patient: [facility]  Procedure(s) Performed: Procedure(s): RIGHT SHOULDER ARTHROSCOPY WITH LABRAL DEBRIDEMENTSUBACROMIAL DECOMPRESSION AND MINI OPEN ROTATOR CUFF REPAIR,   (Right)  Patient Location: PACU  Anesthesia Type:General  Level of Consciousness: awake, alert  and oriented  Airway & Oxygen Therapy: Patient Spontanous Breathing and Patient connected to face mask oxygen  Post-op Assessment: Report given to RN and Post -op Vital signs reviewed and stable  Post vital signs: Reviewed and stable  Last Vitals:  Filed Vitals:   07/02/15 0535  BP: 99/59  Pulse: 72  Temp: 36.5 C  Resp: 16    Last Pain:  Filed Vitals:   07/02/15 0602  PainSc: 4       Patients Stated Pain Goal: 4 (07/02/15 0601)  Complications: No apparent anesthesia complications

## 2016-08-16 NOTE — Progress Notes (Signed)
Please place orders in EPIC as patient has a pre-op appointment on 08/19/2016! Thank you!

## 2016-08-16 NOTE — Progress Notes (Signed)
Please place orders in EPIC as patient is being scheduled for a pre-op appointment! Thank you! 

## 2016-08-17 ENCOUNTER — Ambulatory Visit: Payer: Self-pay | Admitting: Specialist

## 2016-08-17 ENCOUNTER — Encounter (HOSPITAL_COMMUNITY): Payer: Self-pay

## 2016-08-17 NOTE — Patient Instructions (Addendum)
[facility]  08/17/2016   Your procedure is scheduled on: 08-25-16  Report to [facility]  Entrance Take [facility]  elevators to 3rd floor to  Short Stay [facility] at 530  AM.  Call this number if you have problems the morning of surgery [phone]   Remember: ONLY 1 PERSON MAY GO WITH YOU TO SHORT STAY TO GET  READY MORNING OF YOUR SURGERY.  Do not eat food or drink liquids :After Midnight  Take these medicines the morning of surgery with A SIP OF WATER: CHANTIX                               You may not have any metal on your body including hair pins and              piercings  Do not wear jewelry, make-up, lotions, powders or perfumes, deodorant             Do not wear nail polish.  Do not shave  48 hours prior to surgery.              Men may shave face and neck.   Do not bring valuables to the hospital. [facility] IS NOT             RESPONSIBLE   FOR VALUABLES.  Contacts, dentures or bridgework may not be worn into surgery.  Leave suitcase in the car. After surgery it may be brought to your room.     Patients discharged the day of surgery will not be allowed to drive home.  Name and phone number of your driver: SPOUSE ED PT WILL BRING CELL NUMBER DAY OF SUGRERY  Special Instructions: N/A              Please read over the following fact sheets you were given: _____________________________________________________________________             [facility] - Preparing for Surgery Before surgery, you can play an important role.  Because skin is not sterile, your skin needs to be as free of germs as possible.  You can reduce the number of germs on your skin by washing with DIAL soap before surgery.  CHG is an antiseptic cleaner which kills germs and bonds with the skin to continue killing germs even after washing. Please DO NOT use if you have an allergy to DIALor antibacterial soaps.  If your skin becomes reddened/irritated stop using the  DIALand inform your nurse when you arrive at Short Stay. Do not shave (including legs and underarms) for at least 48 hours prior to the first  DIALshower.  You may shave your face/neck. Please follow these instructions carefully:  1.  Shower with DIALSoap the night before surgery and the  morning of Surgery.  2.  If you choose to wash your hair, wash your hair first as usual with your  normal  shampoo.  3.  After you shampoo, rinse your hair and body thoroughly to remove the  shampoo.                           4.  Use DIAL as you would any other liquid soap.              6.  Wash thoroughly, paying special  attention to the area where your surgery  will be performed.  7.  Thoroughly rinse your body with warm water from the neck down.  8.  DO NOT shower/wash with your normal soap after using and rinsing off  the CHG Soap.                9.  Pat yourself dry with a clean towel.            10.  Wear clean pajamas.            11.  Place clean sheets on your bed the night of your first shower and do not  sleep with pets. Day of Surgery : Do not apply any lotions/deodorants the morning of surgery.  Please wear clean clothes to the hospital/surgery [facility].  FAILURE TO FOLLOW THESE INSTRUCTIONS MAY RESULT IN THE CANCELLATION OF YOUR SURGERY PATIENT SIGNATURE_________________________________  NURSE SIGNATURE__________________________________  ________________________________________________________________________   Cassidy MireIncentive Spirometer  An incentive spirometer is a tool that can help keep your lungs clear and active. This tool measures how well you are filling your lungs with each breath. Taking long deep breaths may help reverse or decrease the chance of developing breathing (pulmonary) problems (especially infection) following:  A long period of time when you are unable to move or be active. BEFORE THE PROCEDURE   If the spirometer includes an indicator to show your best effort, your nurse or  respiratory therapist will set it to a desired goal.  If possible, sit up straight or lean slightly forward. Try not to slouch.  Hold the incentive spirometer in an upright position. INSTRUCTIONS FOR USE  1. Sit on the edge of your bed if possible, or sit up as far as you can in bed or on a chair. 2. Hold the incentive spirometer in an upright position. 3. Breathe out normally. 4. Place the mouthpiece in your mouth and seal your lips tightly around it. 5. Breathe in slowly and as deeply as possible, raising the piston or the ball toward the top of the column. 6. Hold your breath for 3-5 seconds or for as long as possible. Allow the piston or ball to fall to the bottom of the column. 7. Remove the mouthpiece from your mouth and breathe out normally. 8. Rest for a few seconds and repeat Steps 1 through 7 at least 10 times every 1-2 hours when you are awake. Take your time and take a few normal breaths between deep breaths. 9. The spirometer may include an indicator to show your best effort. Use the indicator as a goal to work toward during each repetition. 10. After each set of 10 deep breaths, practice coughing to be sure your lungs are clear. If you have an incision (the cut made at the time of surgery), support your incision when coughing by placing a pillow or rolled up towels firmly against it. Once you are able to get out of bed, walk around indoors and cough well. You may stop using the incentive spirometer when instructed by your caregiver.  RISKS AND COMPLICATIONS  Take your time so you do not get dizzy or light-headed.  If you are in pain, you may need to take or ask for pain medication before doing incentive spirometry. It is harder to take a deep breath if you are having pain. AFTER USE  Rest and breathe slowly and easily.  It can be helpful to keep track of a log of your progress. Your caregiver can provide you  with a simple table to help with this. If you are using the  spirometer at home, follow these instructions: [place] IF:   You are having difficultly using the spirometer.  You have trouble using the spirometer as often as instructed.  Your pain medication is not giving enough relief while using the spirometer.  You develop fever of 100.5 F (38.1 C) or higher. SEEK IMMEDIATE MEDICAL CARE IF:   You cough up bloody sputum that had not been present before.  You develop fever of 102 F (38.9 C) or greater.  You develop worsening pain at or near the incision site. MAKE SURE YOU:   Understand these instructions.  Will watch your condition.  Will get help right away if you are not doing well or get worse. Document Released: 07/04/2006 Document Revised: 05/16/2011 Document Reviewed: 09/04/2006 [facility] Patient Information 2014 [place], [place].   ________________________________________________________________________

## 2016-08-19 ENCOUNTER — Encounter (HOSPITAL_COMMUNITY): Payer: Self-pay

## 2016-08-19 ENCOUNTER — Encounter (HOSPITAL_COMMUNITY)
Admission: RE | Admit: 2016-08-19 | Discharge: 2016-08-19 | Disposition: A | Discharge status: Home / Self Care | Payer: Managed Care, Other (non HMO) | Source: Ambulatory Visit | Attending: Specialist | Admitting: Specialist

## 2016-08-19 DIAGNOSIS — M23206 Derangement of unspecified meniscus due to old tear or injury, right knee: Secondary | ICD-10-CM | POA: Insufficient documentation

## 2016-08-19 DIAGNOSIS — M1711 Unilateral primary osteoarthritis, right knee: Secondary | ICD-10-CM | POA: Insufficient documentation

## 2016-08-19 DIAGNOSIS — Z01812 Encounter for preprocedural laboratory examination: Secondary | ICD-10-CM | POA: Insufficient documentation

## 2016-08-19 DIAGNOSIS — K5900 Constipation, unspecified: Secondary | ICD-10-CM | POA: Insufficient documentation

## 2016-08-19 DIAGNOSIS — M23207 Derangement of unspecified meniscus due to old tear or injury, left knee: Secondary | ICD-10-CM | POA: Insufficient documentation

## 2016-08-19 DIAGNOSIS — E039 Hypothyroidism, unspecified: Secondary | ICD-10-CM | POA: Insufficient documentation

## 2016-08-19 HISTORY — DX: Unspecified tear of unspecified meniscus, current injury, left knee, initial encounter: S83.207A

## 2016-08-19 LAB — CBC
HCT: 35.4 % — ABNORMAL LOW (ref 36.0–46.0)
Hemoglobin: 12 g/dL (ref 12.0–15.0)
MCH: 31.6 pg (ref 26.0–34.0)
MCHC: 33.9 g/dL (ref 30.0–36.0)
MCV: 93.2 fL (ref 78.0–100.0)
PLATELETS: 196 10*3/uL (ref 150–400)
RBC: 3.8 MIL/uL — AB (ref 3.87–5.11)
RDW: 12.6 % (ref 11.5–15.5)
WBC: 6.1 10*3/uL (ref 4.0–10.5)

## 2016-08-24 NOTE — H&P (Signed)
[facility] is an 41 y.o. female.   Chief Complaint: left knee pain HPI:  Patient notes persistent  left knee pain.  MRI findings show medial meniscus tear, lateral meniscus tear, mild full-thickness chondral loss on the facet and a Baker cyst..  Patient has failed conservative treatment.  It is felt at this time they would benefit from surgical intervention.  Risks and benefits of surgery discussed with patient and they wish to proceed. Past Medical History:  Diagnosis Date  . Acute meniscal tear of left knee   . Bruises easily   . Chronic constipation   . 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 Family history of adverse reaction to anesthesia    MOTHER- HARD TO WAKE  . Hypothyroidism    s/p right thyroidectomy  . Right knee meniscal tear   . TMJ (temporomandibular joint disorder)     Past Surgical History:  Procedure Laterality Date  . ABDOMINAL HYSTERECTOMY  2009   partial  . bladder tack  2005  . CESAREAN SECTION  2007  . CHOLECYSTECTOMY  2008  . colonscopy     with polyps removed  . HYSTEROSCOPY W/ ENDOMETRIAL ABLATION  2009  . KNEE ARTHROSCOPY  01/14/2011   Procedure: ARTHROSCOPY KNEE;  Surgeon: Javier Docker;  Location: [facility];  Service: Orthopedics;  Laterality: Right;  partial and medial menisectomy WITH DEBRIDEMENT  . KNEE ARTHROSCOPY WITH MEDIAL MENISECTOMY Right 01/10/2014   Procedure: RIGHT ARTHROSCOPY KNEE WITH PARTIAL MEDIAL MENISCECTOMY AND DEBRIDEMENT;  Surgeon: Javier Docker, MD;  Location[facility];  Service: Orthopedics;  Laterality: Right;  . NASAL FRACTURE SURGERY    . SHOULDER ARTHROSCOPY Left 07-17-09   w/ debridement of labral and partial rotator cuff tear  . SHOULDER ARTHROSCOPY WITH ROTATOR CUFF REPAIR AND SUBACROMIAL DECOMPRESSION Right 04/11/2013   Procedure: RIGHT SHOULDER ARTHROSCOPY WITH SUBACROMIAL DECOMPRESSION, LABRIAL DEBRIDEMENT, ROTATOR CUFF DEBRIDEMENT;  Surgeon: Javier Docker, MD;   Location: [facility];  Service: Orthopedics;  Laterality: Right;  . SHOULDER ARTHROSCOPY WITH SUBACROMIAL DECOMPRESSION AND OPEN ROTATOR C Right 07/02/2015   Procedure: RIGHT SHOULDER ARTHROSCOPY WITH LABRAL DEBRIDEMENTSUBACROMIAL DECOMPRESSION AND MINI OPEN ROTATOR CUFF REPAIR,  ;  Surgeon: Jene Every, MD;  Location: [facility];  Service: Orthopedics;  Laterality: Right;  . THYROIDECTOMY Right 2004 (approx)    No family history on file. Social History:  reports that she has been smoking Cigarettes.  She has smoked for the past 12.00 years. She has never used smokeless tobacco. She reports that she does not drink alcohol or use drugs.  Allergies:  Allergies  Allergen Reactions  . Chlorhexidine Gluconate Rash    Patient states no problem with Betadine  . Wellbutrin [Bupropion Hcl] Rash    No prescriptions prior to admission.    No results found for this or any previous visit (from the past 48 hour(s)). No results found.  Review of Systems: The patient denies any fever, chills, night sweats, or bleeding tendencies. CNS: No blurred double vision, seizure, headache, paralysis. RESPIRATORY: No shortness of breath, productive cough, or hemoptysis. CARDIOVASCULAR: No chest pain, angina, or orthopnea, GU: No dysuria, hematuria, or discharge. MUSCULOSKELETAL: Pertinent per HPI.  Last menstrual period 10/13/2005.  GENERAL: Patient is a 41 y.o. female, well-nourished, well-developed, no acute distress. Alert, oriented, cooperative. HENT:  Normocephalic, atraumatic. Pupils round and reactive. EOMs intact. NECK:  Supple, no bruits. CHEST:  Clear to anterior and posterior chest walls. No rhonchi, rales, wheezes. HEART:  Regular, rate and rhythm.  No murmurs.  S1 and S2 noted. ABDOMEN:  Soft, nontender, bowel sounds present. RECTAL/BREAST/GENITALIA:  Not done, not pertinent to present illness. EXTREMITIES:  medial meniscus tear, lateral meniscus tear, mild full-thickness chondral loss on the facet and a  Baker cyst.  Assessment/Plan Left knee medial meniscal tear, lateral meniscal tear   We discussed options, conservative versus operative. With mechanical symptoms and pain, we discussed proceeding with a knee arthroscopy. Dr Shelle IronBeane had a long discussion with the patient concerning the risks and benefits of knee arthroscopy including help from the arthroscopic procedure as well as no help from the arthroscopic procedure or worsening of symptoms. Also discussed infection, DVT, PE, anesthetic complications, etc. Also discussed the possibility of repeat arthroscopic surgery required in the future or total knee replacement. I provided the patient with an illustrated handout and discussed it in detail as well as discussed the postoperative and perioperative courses and return to functional activities including work. Need for postoperative DVT prophylaxis was discussed as well.  Prescription for tramadol to be taken up to three times a day. She is not getting it from any other person. She is fairly painful in both of her knee and her shoulder. We discussed attempting to utilize ibuprofen and Tylenol prior to the tramadol. She has a note for work restrictions at this point.   [facility] 08/24/2016, 4:23 PM

## 2016-08-25 ENCOUNTER — Encounter (HOSPITAL_COMMUNITY)
Admission: RE | Disposition: A | Discharge status: Home / Self Care | Payer: Self-pay | Source: Ambulatory Visit | Attending: Specialist

## 2016-08-25 ENCOUNTER — Encounter (HOSPITAL_COMMUNITY): Payer: Self-pay | Admitting: *Deleted

## 2016-08-25 ENCOUNTER — Ambulatory Visit (HOSPITAL_COMMUNITY)
Admission: RE | Admit: 2016-08-25 | Discharge: 2016-08-25 | Disposition: A | Discharge status: Home / Self Care | Payer: Managed Care, Other (non HMO) | Source: Ambulatory Visit | Attending: Specialist | Admitting: Specialist

## 2016-08-25 ENCOUNTER — Ambulatory Visit (HOSPITAL_COMMUNITY): Payer: Managed Care, Other (non HMO) | Admitting: Certified Registered Nurse Anesthetist

## 2016-08-25 DIAGNOSIS — F17210 Nicotine dependence, cigarettes, uncomplicated: Secondary | ICD-10-CM | POA: Insufficient documentation

## 2016-08-25 DIAGNOSIS — Z79899 Other long term (current) drug therapy: Secondary | ICD-10-CM | POA: Diagnosis not present

## 2016-08-25 DIAGNOSIS — S83282A Other tear of lateral meniscus, current injury, left knee, initial encounter: Secondary | ICD-10-CM | POA: Diagnosis not present

## 2016-08-25 DIAGNOSIS — X58XXXA Exposure to other specified factors, initial encounter: Secondary | ICD-10-CM | POA: Insufficient documentation

## 2016-08-25 DIAGNOSIS — M7122 Synovial cyst of popliteal space [Baker], left knee: Secondary | ICD-10-CM | POA: Insufficient documentation

## 2016-08-25 DIAGNOSIS — K5909 Other constipation: Secondary | ICD-10-CM | POA: Diagnosis not present

## 2016-08-25 DIAGNOSIS — E890 Postprocedural hypothyroidism: Secondary | ICD-10-CM | POA: Insufficient documentation

## 2016-08-25 DIAGNOSIS — M25562 Pain in left knee: Secondary | ICD-10-CM | POA: Diagnosis present

## 2016-08-25 DIAGNOSIS — M2242 Chondromalacia patellae, left knee: Secondary | ICD-10-CM | POA: Insufficient documentation

## 2016-08-25 DIAGNOSIS — S83242A Other tear of medial meniscus, current injury, left knee, initial encounter: Secondary | ICD-10-CM | POA: Insufficient documentation

## 2016-08-25 HISTORY — PX: KNEE ARTHROSCOPY WITH LATERAL MENISECTOMY: SHX6193

## 2016-08-25 SURGERY — ARTHROSCOPY, KNEE, WITH LATERAL MENISCECTOMY
Anesthesia: General | Site: Knee | Laterality: Left

## 2016-08-25 MED ORDER — CEFAZOLIN SODIUM-DEXTROSE 2-4 GM/100ML-% IV SOLN
INTRAVENOUS | Status: AC
  Filled 2016-08-25: qty 100

## 2016-08-25 MED ORDER — OXYCODONE-ACETAMINOPHEN 5-325 MG PO TABS: 1.0000 | ORAL_TABLET | ORAL | 0 refills | Status: AC | PRN

## 2016-08-25 MED ORDER — IBUPROFEN 800 MG PO TABS: 800.0000 mg | ORAL_TABLET | Freq: Three times a day (TID) | ORAL | 1 refills | Status: AC | PRN

## 2016-08-25 MED ORDER — FENTANYL CITRATE (PF) 100 MCG/2ML IJ SOLN
INTRAMUSCULAR | Status: DC | PRN
  Administered 2016-08-25 (×3): 25 ug via INTRAVENOUS
  Administered 2016-08-25 (×2): 50 ug via INTRAVENOUS

## 2016-08-25 MED ORDER — ONDANSETRON HCL 4 MG/2ML IJ SOLN
INTRAMUSCULAR | Status: AC
  Filled 2016-08-25: qty 2

## 2016-08-25 MED ORDER — FENTANYL CITRATE (PF) 100 MCG/2ML IJ SOLN
25.0000 ug | INTRAMUSCULAR | Status: DC | PRN
  Administered 2016-08-25 (×3): 50 ug via INTRAVENOUS

## 2016-08-25 MED ORDER — FENTANYL CITRATE (PF) 100 MCG/2ML IJ SOLN
INTRAMUSCULAR | Status: AC
  Filled 2016-08-25: qty 2

## 2016-08-25 MED ORDER — DEXAMETHASONE SODIUM PHOSPHATE 10 MG/ML IJ SOLN
INTRAMUSCULAR | Status: DC | PRN
  Administered 2016-08-25: 5 mg via INTRAVENOUS

## 2016-08-25 MED ORDER — EPINEPHRINE PF 1 MG/ML IJ SOLN
INTRAMUSCULAR | Status: DC | PRN
  Administered 2016-08-25: 1 mg

## 2016-08-25 MED ORDER — LACTATED RINGERS IV SOLN
INTRAVENOUS | Status: DC
  Administered 2016-08-25: 07:00:00 via INTRAVENOUS
  Administered 2016-08-25: 1000 mL via INTRAVENOUS

## 2016-08-25 MED ORDER — DOCUSATE SODIUM 100 MG PO CAPS: 100.0000 mg | ORAL_CAPSULE | Freq: Two times a day (BID) | ORAL | 1 refills | Status: AC | PRN

## 2016-08-25 MED ORDER — FENTANYL CITRATE (PF) 100 MCG/2ML IJ SOLN
INTRAMUSCULAR | Status: DC
Start: 2016-08-25 — End: 2016-08-25
  Filled 2016-08-25: qty 2

## 2016-08-25 MED ORDER — PROPOFOL 10 MG/ML IV BOLUS
INTRAVENOUS | Status: AC
  Filled 2016-08-25: qty 20

## 2016-08-25 MED ORDER — EPINEPHRINE PF 1 MG/ML IJ SOLN
INTRAMUSCULAR | Status: AC
  Filled 2016-08-25: qty 2

## 2016-08-25 MED ORDER — CEFAZOLIN SODIUM-DEXTROSE 2-4 GM/100ML-% IV SOLN
2.0000 g | INTRAVENOUS | Status: AC
  Administered 2016-08-25: 2 g via INTRAVENOUS
  Filled 2016-08-25: qty 100

## 2016-08-25 MED ORDER — MIDAZOLAM HCL 2 MG/2ML IJ SOLN
INTRAMUSCULAR | Status: AC
  Filled 2016-08-25: qty 2

## 2016-08-25 MED ORDER — DEXAMETHASONE SODIUM PHOSPHATE 10 MG/ML IJ SOLN
INTRAMUSCULAR | Status: AC
  Filled 2016-08-25: qty 1

## 2016-08-25 MED ORDER — EPHEDRINE SULFATE 50 MG/ML IJ SOLN
INTRAMUSCULAR | Status: DC | PRN
  Administered 2016-08-25: 5 mg via INTRAVENOUS
  Administered 2016-08-25: 10 mg via INTRAVENOUS

## 2016-08-25 MED ORDER — BUPIVACAINE-EPINEPHRINE (PF) 0.5% -1:200000 IJ SOLN
INTRAMUSCULAR | Status: AC
  Filled 2016-08-25: qty 30

## 2016-08-25 MED ORDER — BUPIVACAINE-EPINEPHRINE 0.5% -1:200000 IJ SOLN
INTRAMUSCULAR | Status: DC | PRN
  Administered 2016-08-25: 20 mL

## 2016-08-25 MED ORDER — LACTATED RINGERS IR SOLN
Status: DC | PRN
  Administered 2016-08-25: 6000 mL

## 2016-08-25 MED ORDER — ONDANSETRON HCL 4 MG/2ML IJ SOLN
INTRAMUSCULAR | Status: DC | PRN
  Administered 2016-08-25: 4 mg via INTRAVENOUS

## 2016-08-25 MED ORDER — OXYCODONE-ACETAMINOPHEN 5-325 MG PO TABS
1.0000 | ORAL_TABLET | Freq: Once | ORAL | Status: AC
  Administered 2016-08-25: 1 via ORAL
  Filled 2016-08-25: qty 1

## 2016-08-25 MED ORDER — POVIDONE-IODINE 7.5 % EX SOLN: Freq: Once | CUTANEOUS | Status: DC

## 2016-08-25 MED ORDER — MIDAZOLAM HCL 5 MG/5ML IJ SOLN
INTRAMUSCULAR | Status: DC | PRN
  Administered 2016-08-25: 2 mg via INTRAVENOUS

## 2016-08-25 MED ORDER — PROPOFOL 10 MG/ML IV BOLUS
INTRAVENOUS | Status: DC | PRN
  Administered 2016-08-25: 150 mg via INTRAVENOUS
  Administered 2016-08-25: 50 mg via INTRAVENOUS

## 2016-08-25 MED ORDER — LIDOCAINE 2% (20 MG/ML) 5 ML SYRINGE
INTRAMUSCULAR | Status: DC | PRN
  Administered 2016-08-25: 100 mg via INTRAVENOUS

## 2016-08-25 SURGICAL SUPPLY — 30 items
BANDAGE ACE 6X5 VEL STRL LF (GAUZE/BANDAGES/DRESSINGS) ×3 IMPLANT
BLADE 4.2CUDA (BLADE) IMPLANT
BLADE CUDA SHAVER 3.5 (BLADE) ×3 IMPLANT
BLADE SURG SZ11 CARB STEEL (BLADE) ×3 IMPLANT
BOOTIES KNEE HIGH SLOAN (MISCELLANEOUS) ×3 IMPLANT
CLOTH 2% CHLOROHEXIDINE 3PK (PERSONAL CARE ITEMS) IMPLANT
COVER SURGICAL LIGHT HANDLE (MISCELLANEOUS) ×3 IMPLANT
DRSG EMULSION OIL 3X3 NADH (GAUZE/BANDAGES/DRESSINGS) ×3 IMPLANT
DRSG PAD ABDOMINAL 8X10 ST (GAUZE/BANDAGES/DRESSINGS) ×3 IMPLANT
DURAPREP 26ML APPLICATOR (WOUND CARE) ×3 IMPLANT
GAUZE SPONGE 4X4 12PLY STRL (GAUZE/BANDAGES/DRESSINGS) ×3 IMPLANT
GLOVE BIOGEL M 7.0 STRL (GLOVE) IMPLANT
GLOVE BIOGEL PI IND STRL 7.0 (GLOVE) IMPLANT
GLOVE BIOGEL PI IND STRL 8 (GLOVE) IMPLANT
GLOVE BIOGEL PI INDICATOR 7.0 (GLOVE)
GLOVE BIOGEL PI INDICATOR 8 (GLOVE)
GLOVE SURG SS PI 7.0 STRL IVOR (GLOVE) IMPLANT
GLOVE SURG SS PI 7.5 STRL IVOR (GLOVE) IMPLANT
GLOVE SURG SS PI 8.0 STRL IVOR (GLOVE) ×3 IMPLANT
GOWN STRL REUS W/TWL LRG LVL3 (GOWN DISPOSABLE) IMPLANT
GOWN STRL REUS W/TWL XL LVL3 (GOWN DISPOSABLE) ×3 IMPLANT
KIT BASIN OR (CUSTOM PROCEDURE TRAY) IMPLANT
MANIFOLD NEPTUNE II (INSTRUMENTS) ×3 IMPLANT
PACK ARTHROSCOPY WL (CUSTOM PROCEDURE TRAY) ×3 IMPLANT
PADDING CAST COTTON 6X4 STRL (CAST SUPPLIES) ×3 IMPLANT
SUT ETHILON 4 0 PS 2 18 (SUTURE) ×3 IMPLANT
TOWEL OR 17X26 10 PK STRL BLUE (TOWEL DISPOSABLE) ×3 IMPLANT
TUBING ARTHRO INFLOW-ONLY STRL (TUBING) ×3 IMPLANT
WAND HAND CNTRL MULTIVAC 50 (MISCELLANEOUS) IMPLANT
WRAP KNEE MAXI GEL POST OP (GAUZE/BANDAGES/DRESSINGS) ×3 IMPLANT

## 2016-08-25 NOTE — Discharge Instructions (Signed)

## 2016-08-25 NOTE — Anesthesia Preprocedure Evaluation (Signed)
Anesthesia Evaluation  Patient identified by MRN, date of birth, ID band Patient awake    Reviewed: Allergy & Precautions, NPO status , Patient's Chart, lab work & pertinent test results  Airway Mallampati: II  TM Distance: >3 FB     Dental   Pulmonary Current Smoker,    breath sounds clear to auscultation       Cardiovascular negative cardio ROS   Rhythm:Regular Rate:Normal     Neuro/Psych    GI/Hepatic negative GI ROS, Neg liver ROS,   Endo/Other  Hypothyroidism   Renal/GU      Musculoskeletal  (+) Arthritis ,   Abdominal   Peds  Hematology   Anesthesia Other Findings   Reproductive/Obstetrics                             Anesthesia Physical Anesthesia Plan  ASA: II  Anesthesia Plan: General   Post-op Pain Management:    Induction: Intravenous  PONV Risk Score and Plan: 2 and Ondansetron, Midazolam and Propofol  Airway Management Planned: LMA  Additional Equipment:   Intra-op Plan:   Post-operative Plan: Extubation in OR  Informed Consent: I have reviewed the patients History and Physical, chart, labs and discussed the procedure including the risks, benefits and alternatives for the proposed anesthesia with the patient or authorized representative who has indicated his/her understanding and acceptance.   Dental advisory given  Plan Discussed with: CRNA and Anesthesiologist  Anesthesia Plan Comments:         Anesthesia Quick Evaluation

## 2016-08-25 NOTE — Anesthesia Postprocedure Evaluation (Signed)
Anesthesia Post Note  Patient: [facility]  Procedure(s) Performed: Procedure(s) (LRB): Left knee arthroscopy, partial medial and lateral menisectomy and debridement (Left)     Patient location during evaluation: PACU Anesthesia Type: General Level of consciousness: awake Pain management: pain level controlled Vital Signs Assessment: post-procedure vital signs reviewed and stable Respiratory status: spontaneous breathing Cardiovascular status: stable Anesthetic complications: no    Last Vitals:  Vitals:   08/25/16 0927 08/25/16 1024  BP: (!) 98/58 (!) 101/59  Pulse: (!) 51 63  Resp: 14 18  Temp: 36.8 C 36.6 C    Last Pain:  Vitals:   08/25/16 1024  TempSrc: Oral  PainSc: 3                  Numa Heatwole

## 2016-08-25 NOTE — Brief Op Note (Signed)
08/25/2016  8:20 AM  PATIENT:  [facility]  41 y.o. female  PRE-OPERATIVE DIAGNOSIS:  Medial meniscal tear left knee  POST-OPERATIVE DIAGNOSIS:  medial and lateral menisectomy  PROCEDURE:  Procedure(s) with comments: Left knee arthroscopy, partial medial and lateral menisectomy and debridement (Left) - 60 mins  SURGEON:  Surgeon(s) and Role:    Jene Every* Amala Petion, MD - Primary  PHYSICIAN ASSISTANT:   ASSISTANTS: None   ANESTHESIA:   general  EBL:  No intake/output data recorded.  BLOOD ADMINISTERED:none  DRAINS: none   LOCAL MEDICATIONS USED:  MARCAINE     SPECIMEN:  No Specimen  DISPOSITION OF SPECIMEN:  N/A  COUNTS:  YES  TOURNIQUET:  * No tourniquets in log *  DICTATION: .Other Dictation: Dictation Number [id number]  PLAN OF CARE: Discharge to home after PACU  PATIENT DISPOSITION:  PACU - hemodynamically stable.   Delay start of Pharmacological VTE agent (>24hrs) due to surgical blood loss or risk of bleeding: no

## 2016-08-25 NOTE — Transfer of Care (Signed)
Immediate Anesthesia Transfer of Care Note  Patient: [facility]  Procedure(s) Performed: Procedure(s) with comments: Left knee arthroscopy, partial medial and lateral menisectomy and debridement (Left) - 60 mins  Patient Location: PACU  Anesthesia Type:General  Level of Consciousness:  sedated, patient cooperative and responds to stimulation  Airway & Oxygen Therapy:Patient Spontanous Breathing and Patient connected to face mask oxgen  Post-op Assessment:  Report given to PACU RN and Post -op Vital signs reviewed and stable  Post vital signs:  Reviewed and stable  Last Vitals:  Vitals:   08/25/16 0533  BP: 108/73  Pulse: 78  Resp: 16  Temp: 36.6 C    Complications: No apparent anesthesia complications

## 2016-08-25 NOTE — Op Note (Signed)
NAMT[place] Headings:  [facility], [facility] Walker               ACCOUNT NO.:  [id number]  MEDICAL RECORD NO.:  [id number]  LOCATION:                                FACILITY:  [facility]  PHYSICIAN:  Jene EveryJeffrey Hikari Tripp, M.D.         DATE OF BIRTH:  DATE OF PROCEDURE:  08/25/2016 DATE OF DISCHARGE:                              OPERATIVE REPORT   PREOPERATIVE DIAGNOSES:  Medial meniscus tear, lateral meniscus tear, degenerative joint disease, left knee.  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  PROCEDURES PERFORMED: 1. Left knee arthroscopy. 2. Chondroplasty of medial femoral condyle and patella. 3. Partial lateral meniscectomy. 4. Shaving of the medial meniscus.  ANESTHESIA:  General.  ASSISTANT:  None.  HISTORY:  A 41 year old with locking, popping, giving way.  MRI indicating meniscal tears, predominantly medial posterior root, lateral posterior root, chondromalacia.  She had been refractory to conservative treatment, with mechanical symptoms.  It was indicated for knee arthroscopy, partial meniscectomy and debridement.  Risks and benefits discussed including bleeding, infection, no change in symptoms, worsening symptoms, DVT, PE, anesthetic complications, etc.  TECHNIQUE:  With the patient in supine position, after induction of adequate general anesthesia, __________ g Kefzol, left lower extremity was prepped and draped in usual sterile fashion.  Lateral parapatellar portal was fashioned with a #11 blade.  Ingress cannula atraumatically placed.  Irrigant was utilized to insufflate the joint.  Under direct visualization, medial parapatellar portal was fashioned with a #11 blade after localization with 18-gauge needle, sparing the medial meniscus. Noted were chondral flap tears with loose cartilaginous debris in the medial compartment.  Introduced a 3.5 shaver and performed a very light chondroplasty in the medial femoral  condyle, evacuating the loose bodies and removing the chondral flap tears.  There were no grade 4 changes. It was approximately an area of 2 x 1.5 cm.  The posterior horn of the medial meniscus has a small tear.  We introduced a shaver to the posterior horn, was able to only shave a portion of it.  With the remnant was stable, it did not sublux into the joint.  ACL was unremarkable.  Lateral compartment revealed a posterior root tear and some grade 3 chondromalacia of the tibial plateau there.  Light chondroplasty performed there and I shaved the root of the meniscus to a stable base.  Suprapatellar pouch, some minor grade 3 changes of the patella.  Light chondroplasty performed here.  Normal patellofemoral tracking.  Gutters were unremarkable.  Revisited all compartments.  No further pathology amenable to arthroscopic intervention.  Therefore, removed all instrumentation.  Portals were closed with 4-0 nylon simple sutures. 0.25% Marcaine with epinephrine was infiltrated in the joint.  Wound was dressed sterilely.  Awoken without difficulty and transported to the recovery room in satisfactory condition.  The patient tolerated the procedure well.  No complications.     Jene EveryJeffrey Camdan Burdi, M.D.   ______________________________ Jene EveryJeffrey Wendelin Reader, M.D.    Cordelia PenJB/MEDQ  D:  08/25/2016  T:  08/25/2016  Job:  [id number]

## 2016-08-25 NOTE — Anesthesia Procedure Notes (Signed)
Procedure Name: LMA Insertion Date/Time: 08/25/2016 7:42 AM Performed by: Wynonia SoursWALKER, Tashala Cumbo L Pre-anesthesia Checklist: Patient identified, Emergency Drugs available, Suction available, Patient being monitored and Timeout performed Patient Re-evaluated:Patient Re-evaluated prior to inductionOxygen Delivery Method: Circle system utilized Preoxygenation: Pre-oxygenation with 100% oxygen Intubation Type: IV induction Ventilation: Mask ventilation without difficulty LMA: LMA with gastric port inserted LMA Size: 4.0 Number of attempts: 1 Placement Confirmation: CO2 detector,  positive ETCO2 and breath sounds checked- equal and bilateral Tube secured with: Tape Dental Injury: Teeth and Oropharynx as per pre-operative assessment

## 2016-12-30 ENCOUNTER — Ambulatory Visit: Payer: Self-pay | Admitting: Orthopedic Surgery

## 2017-01-02 ENCOUNTER — Ambulatory Visit: Payer: Self-pay | Admitting: Orthopedic Surgery

## 2017-01-02 NOTE — H&P (Signed)
Cassidy Walker is an 41 y.o. female.   Chief Complaint: left knee pain HPI: The patient is a 41 year old female presenting for a post-operative visit. Patient is 4 months postop following their left knee arthroscopy.Overall the patient feels that they are not doing well. Post operative pain has been 4/10. The patient does report pain (locking and giving away) Pain medications include: Ultram and Goody Powder . The patient is on the following DVT prophylaxis treatment: Aspirin . The patient is currently 100 percent weightbearing without the use of assistive devices. The patient is currently not working.  Note:Patient reports the knee feels just like it did before the surgery in terms of a meniscus tear of it having pain on the medial side and giving way.  She has to return to work though when she like to return and trial returned to work at her regular duties if it persists we discussed revision arthroscopy. She is taking tramadol.  Past Medical History:  Diagnosis Date  . Acute meniscal tear of left knee   . Bruises easily   . Chronic constipation   . 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 Family history of adverse reaction to anesthesia    MOTHER- HARD TO WAKE  . Hypothyroidism    s/p right thyroidectomy  . Right knee meniscal tear   . TMJ (temporomandibular joint disorder)     Past Surgical History:  Procedure Laterality Date  . ABDOMINAL HYSTERECTOMY  2009   partial  . bladder tack  2005  . CESAREAN SECTION  2007  . CHOLECYSTECTOMY  2008  . colonscopy     with polyps removed  . HYSTEROSCOPY W/ ENDOMETRIAL ABLATION  2009  . KNEE ARTHROSCOPY  01/14/2011   Procedure: ARTHROSCOPY KNEE;  Surgeon: Cassidy Walker;  Location: [facility];  Service: Orthopedics;  Laterality: Right;  partial and medial menisectomy WITH DEBRIDEMENT  . KNEE ARTHROSCOPY WITH LATERAL MENISECTOMY Left 08/25/2016   Procedure: Left knee arthroscopy, partial medial and lateral  menisectomy and debridement;  Surgeon: Cassidy Walker;  Location: [facility];  Service: Orthopedics;  Laterality: Left;  60 mins  . KNEE ARTHROSCOPY WITH MEDIAL MENISECTOMY Right 01/10/2014   Procedure: RIGHT ARTHROSCOPY KNEE WITH PARTIAL MEDIAL MENISCECTOMY AND DEBRIDEMENT;  Surgeon: Cassidy Walker;  [facility];  Service: Orthopedics;  Laterality: Right;  . NASAL FRACTURE SURGERY    . SHOULDER ARTHROSCOPY Left 07-17-09   w/ debridement of labral and partial rotator cuff tear  . SHOULDER ARTHROSCOPY WITH ROTATOR CUFF REPAIR AND SUBACROMIAL DECOMPRESSION Right 04/11/2013   Procedure: RIGHT SHOULDER ARTHROSCOPY WITH SUBACROMIAL DECOMPRESSION, LABRIAL DEBRIDEMENT, ROTATOR CUFF DEBRIDEMENT;  Surgeon: Cassidy Walker;  Location: [facility];  Service: Orthopedics;  Laterality: Right;  . SHOULDER ARTHROSCOPY WITH SUBACROMIAL DECOMPRESSION AND OPEN ROTATOR C Right 07/02/2015   Procedure: RIGHT SHOULDER ARTHROSCOPY WITH LABRAL DEBRIDEMENTSUBACROMIAL DECOMPRESSION AND MINI OPEN ROTATOR CUFF REPAIR,  ;  Surgeon: Cassidy EveryJeffrey Beane, Walker;  Location: [facility];  Service: Orthopedics;  Laterality: Right;  . THYROIDECTOMY Right 2004 (approx)    No family history on file. Social History:  reports that she has been smoking Cigarettes.  She has smoked for the past 12.00 years. She has never used smokeless tobacco. She reports that she does not drink alcohol or use drugs.  Allergies:  Allergies  Allergen Reactions  . Chlorhexidine Gluconate Rash    Patient states no problem with Betadine  . Wellbutrin [Bupropion Hcl] Rash     (  Not in a hospital admission)  No results found for this or any previous visit (from the past 48 hour(s)). No results found.  Review of Systems  Constitutional: Negative.   HENT: Negative.   Eyes: Negative.   Respiratory: Negative.   Cardiovascular: Negative.   Gastrointestinal: Negative.   Genitourinary: Negative.   Musculoskeletal: Positive for joint pain.   Skin: Negative.   Neurological: Negative.     Last menstrual period 10/13/2005. Physical Exam  Constitutional: She is oriented to person, place, and time. She appears well-developed.  HENT:  Head: Normocephalic.  Eyes: Pupils are equal, round, and reactive to light.  Neck: Normal range of motion.  Cardiovascular: Normal rate.   Respiratory: Effort normal.  GI: Soft.  Musculoskeletal:  Walks with an antalgic gait. She is minimally tender in the medial joint line equivocal Mc[place]ray. Trace effusion. Range 0-140 of subtle hip and ankle exam is unremarkable. No DVT.   Neurological: She is alert and oriented to person, place, and time.     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 Patient is 4 months status post arthroscopy unfortunately had a postoperative fall.  Plan this point we will try returning to work. Continue her exercise program. Continue brace. Tramadol when necessary. We discussed a revision left knee arthroscopy debridement and partial meniscectomy. If she returns to work and has I intolerable pain she will call or proceed with revision arthroscopy. We discussed the risks and benefits of that.  I had a long discussion with the patient concerning the risks and benefits of knee arthroscopy including help from the arthroscopic procedure as well as no help from the arthroscopic procedure or worsening of symptoms. Also discussed infection, DVT, PE, anesthetic complications, etc. Also discussed the possibility of repeat arthroscopic surgery required in the future or total knee replacement. I provided the patient with an illustrated handout and discussed it in detail as well as discussed the postoperative and perioperative courses and return to functional activities including work. Need for postoperative DVT prophylaxis was discussed as well.  Plan left knee arthroscopy, debridement, partial medial meniscectomy,  chondroplasty  Dorothy Spark., PA-C for Dr. Shelle Walker 01/02/2017, 8:57 AM

## 2017-01-02 NOTE — H&P (Deleted)
  The note originally documented on this encounter has been moved the the encounter in which it belongs.  

## 2017-01-17 ENCOUNTER — Other Ambulatory Visit: Payer: Self-pay

## 2017-01-17 ENCOUNTER — Encounter (HOSPITAL_BASED_OUTPATIENT_CLINIC_OR_DEPARTMENT_OTHER): Payer: Self-pay | Admitting: *Deleted

## 2017-01-17 NOTE — Progress Notes (Signed)
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 Needs hemaglobin.

## 2017-01-20 ENCOUNTER — Encounter (HOSPITAL_BASED_OUTPATIENT_CLINIC_OR_DEPARTMENT_OTHER)
Admission: RE | Disposition: A | Discharge status: Home / Self Care | Payer: Self-pay | Source: Ambulatory Visit | Attending: Specialist

## 2017-01-20 ENCOUNTER — Ambulatory Visit (HOSPITAL_BASED_OUTPATIENT_CLINIC_OR_DEPARTMENT_OTHER): Payer: 59 | Admitting: Anesthesiology

## 2017-01-20 ENCOUNTER — Ambulatory Visit (HOSPITAL_BASED_OUTPATIENT_CLINIC_OR_DEPARTMENT_OTHER)
Admission: RE | Admit: 2017-01-20 | Discharge: 2017-01-20 | Disposition: A | Discharge status: Home / Self Care | Payer: 59 | Source: Ambulatory Visit | Attending: Specialist | Admitting: Specialist

## 2017-01-20 ENCOUNTER — Encounter (HOSPITAL_BASED_OUTPATIENT_CLINIC_OR_DEPARTMENT_OTHER): Payer: Self-pay | Admitting: *Deleted

## 2017-01-20 ENCOUNTER — Other Ambulatory Visit: Payer: Self-pay

## 2017-01-20 DIAGNOSIS — E890 Postprocedural hypothyroidism: Secondary | ICD-10-CM | POA: Insufficient documentation

## 2017-01-20 DIAGNOSIS — W19XXXA Unspecified fall, initial encounter: Secondary | ICD-10-CM | POA: Diagnosis not present

## 2017-01-20 DIAGNOSIS — M94262 Chondromalacia, left knee: Secondary | ICD-10-CM | POA: Diagnosis not present

## 2017-01-20 DIAGNOSIS — Z888 Allergy status to other drugs, medicaments and biological substances status: Secondary | ICD-10-CM | POA: Diagnosis not present

## 2017-01-20 DIAGNOSIS — K5909 Other constipation: Secondary | ICD-10-CM | POA: Diagnosis not present

## 2017-01-20 DIAGNOSIS — S83282A Other tear of lateral meniscus, current injury, left knee, initial encounter: Secondary | ICD-10-CM | POA: Insufficient documentation

## 2017-01-20 DIAGNOSIS — M1711 Unilateral primary osteoarthritis, right knee: Secondary | ICD-10-CM | POA: Diagnosis not present

## 2017-01-20 DIAGNOSIS — F17210 Nicotine dependence, cigarettes, uncomplicated: Secondary | ICD-10-CM | POA: Diagnosis not present

## 2017-01-20 DIAGNOSIS — Y939 Activity, unspecified: Secondary | ICD-10-CM | POA: Diagnosis not present

## 2017-01-20 DIAGNOSIS — G8918 Other acute postprocedural pain: Secondary | ICD-10-CM | POA: Diagnosis present

## 2017-01-20 DIAGNOSIS — Z90710 Acquired absence of both cervix and uterus: Secondary | ICD-10-CM | POA: Diagnosis not present

## 2017-01-20 DIAGNOSIS — S83242A Other tear of medial meniscus, current injury, left knee, initial encounter: Secondary | ICD-10-CM | POA: Insufficient documentation

## 2017-01-20 DIAGNOSIS — Z9049 Acquired absence of other specified parts of digestive tract: Secondary | ICD-10-CM | POA: Diagnosis not present

## 2017-01-20 HISTORY — PX: KNEE ARTHROSCOPY WITH MEDIAL MENISECTOMY: SHX5651

## 2017-01-20 LAB — POCT HEMOGLOBIN-HEMACUE: Hemoglobin: 12.8 g/dL (ref 12.0–15.0)

## 2017-01-20 SURGERY — ARTHROSCOPY, KNEE, WITH MEDIAL MENISCECTOMY
Anesthesia: General | Site: Knee | Laterality: Left

## 2017-01-20 MED ORDER — OXYCODONE-ACETAMINOPHEN 5-325 MG PO TABS: 1.0000 | ORAL_TABLET | ORAL | 0 refills | Status: AC | PRN

## 2017-01-20 MED ORDER — METHOCARBAMOL 1000 MG/10ML IJ SOLN
500.0000 mg | Freq: Once | INTRAVENOUS | Status: AC
  Administered 2017-01-20: 500 mg via INTRAVENOUS
  Filled 2017-01-20: qty 5
  Filled 2017-01-20: qty 550

## 2017-01-20 MED ORDER — OXYCODONE-ACETAMINOPHEN 5-325 MG PO TABS
1.0000 | ORAL_TABLET | ORAL | Status: DC | PRN
  Administered 2017-01-20: 1 via ORAL
  Filled 2017-01-20: qty 1

## 2017-01-20 MED ORDER — LIDOCAINE 2% (20 MG/ML) 5 ML SYRINGE
INTRAMUSCULAR | Status: DC | PRN
  Administered 2017-01-20: 60 mg via INTRAVENOUS

## 2017-01-20 MED ORDER — GABAPENTIN 300 MG PO CAPS
ORAL_CAPSULE | ORAL | Status: AC
  Filled 2017-01-20: qty 1

## 2017-01-20 MED ORDER — FENTANYL CITRATE (PF) 100 MCG/2ML IJ SOLN
INTRAMUSCULAR | Status: DC | PRN
  Administered 2017-01-20 (×2): 50 ug via INTRAVENOUS

## 2017-01-20 MED ORDER — FENTANYL CITRATE (PF) 100 MCG/2ML IJ SOLN
INTRAMUSCULAR | Status: AC
  Filled 2017-01-20: qty 4

## 2017-01-20 MED ORDER — LIDOCAINE 2% (20 MG/ML) 5 ML SYRINGE
INTRAMUSCULAR | Status: AC
  Filled 2017-01-20: qty 5

## 2017-01-20 MED ORDER — MIDAZOLAM HCL 2 MG/2ML IJ SOLN
INTRAMUSCULAR | Status: AC
  Filled 2017-01-20: qty 2

## 2017-01-20 MED ORDER — GABAPENTIN 300 MG PO CAPS
300.0000 mg | ORAL_CAPSULE | Freq: Once | ORAL | Status: AC
  Administered 2017-01-20: 300 mg via ORAL
  Filled 2017-01-20: qty 1

## 2017-01-20 MED ORDER — SODIUM CHLORIDE 0.9 % IR SOLN
Status: DC | PRN
  Administered 2017-01-20: 3000 mL

## 2017-01-20 MED ORDER — DEXAMETHASONE SODIUM PHOSPHATE 10 MG/ML IJ SOLN
INTRAMUSCULAR | Status: DC | PRN
  Administered 2017-01-20: 10 mg via INTRAVENOUS

## 2017-01-20 MED ORDER — PROPOFOL 10 MG/ML IV BOLUS
INTRAVENOUS | Status: DC | PRN
  Administered 2017-01-20: 140 mg via INTRAVENOUS

## 2017-01-20 MED ORDER — ACETAMINOPHEN 500 MG PO TABS
ORAL_TABLET | ORAL | Status: AC
  Filled 2017-01-20: qty 2

## 2017-01-20 MED ORDER — ONDANSETRON HCL 4 MG/2ML IJ SOLN
INTRAMUSCULAR | Status: AC
  Filled 2017-01-20: qty 2

## 2017-01-20 MED ORDER — FENTANYL CITRATE (PF) 100 MCG/2ML IJ SOLN
25.0000 ug | INTRAMUSCULAR | Status: DC | PRN
  Filled 2017-01-20: qty 1

## 2017-01-20 MED ORDER — OXYCODONE-ACETAMINOPHEN 5-325 MG PO TABS
ORAL_TABLET | ORAL | Status: AC
  Filled 2017-01-20: qty 1

## 2017-01-20 MED ORDER — PROPOFOL 10 MG/ML IV BOLUS
INTRAVENOUS | Status: AC
  Filled 2017-01-20: qty 20

## 2017-01-20 MED ORDER — PROMETHAZINE HCL 25 MG/ML IJ SOLN
6.2500 mg | INTRAMUSCULAR | Status: DC | PRN
  Filled 2017-01-20: qty 1

## 2017-01-20 MED ORDER — DEXAMETHASONE SODIUM PHOSPHATE 10 MG/ML IJ SOLN
INTRAMUSCULAR | Status: AC
  Filled 2017-01-20: qty 1

## 2017-01-20 MED ORDER — LACTATED RINGERS IV SOLN
INTRAVENOUS | Status: DC
  Administered 2017-01-20: 13:00:00 via INTRAVENOUS
  Filled 2017-01-20: qty 1000

## 2017-01-20 MED ORDER — BUPIVACAINE-EPINEPHRINE 0.5% -1:200000 IJ SOLN
INTRAMUSCULAR | Status: DC | PRN
  Administered 2017-01-20: 20 mL

## 2017-01-20 MED ORDER — EPINEPHRINE PF 1 MG/ML IJ SOLN
INTRAMUSCULAR | Status: DC | PRN
  Administered 2017-01-20: 1 mg

## 2017-01-20 MED ORDER — CEFAZOLIN SODIUM-DEXTROSE 2-4 GM/100ML-% IV SOLN
2.0000 g | INTRAVENOUS | Status: AC
  Administered 2017-01-20: 2 g via INTRAVENOUS
  Filled 2017-01-20: qty 100

## 2017-01-20 MED ORDER — ACETAMINOPHEN 500 MG PO TABS
1000.0000 mg | ORAL_TABLET | Freq: Once | ORAL | Status: AC
  Administered 2017-01-20: 1000 mg via ORAL
  Filled 2017-01-20: qty 2

## 2017-01-20 MED ORDER — MIDAZOLAM HCL 2 MG/2ML IJ SOLN
INTRAMUSCULAR | Status: DC | PRN
  Administered 2017-01-20: 2 mg via INTRAVENOUS

## 2017-01-20 MED ORDER — KETOROLAC TROMETHAMINE 30 MG/ML IJ SOLN
30.0000 mg | Freq: Once | INTRAMUSCULAR | Status: AC
  Administered 2017-01-20: 30 mg via INTRAVENOUS
  Filled 2017-01-20: qty 1

## 2017-01-20 MED ORDER — LACTATED RINGERS IV SOLN
INTRAVENOUS | Status: DC
  Administered 2017-01-20: 12:00:00 via INTRAVENOUS
  Filled 2017-01-20: qty 1000

## 2017-01-20 MED ORDER — ONDANSETRON HCL 4 MG/2ML IJ SOLN
INTRAMUSCULAR | Status: DC | PRN
  Administered 2017-01-20: 4 mg via INTRAVENOUS

## 2017-01-20 MED ORDER — KETOROLAC TROMETHAMINE 30 MG/ML IJ SOLN
INTRAMUSCULAR | Status: AC
  Filled 2017-01-20: qty 1

## 2017-01-20 MED ORDER — CEFAZOLIN SODIUM-DEXTROSE 2-4 GM/100ML-% IV SOLN
INTRAVENOUS | Status: AC
  Filled 2017-01-20: qty 100

## 2017-01-20 SURGICAL SUPPLY — 46 items
BANDAGE ACE 6X5 VEL STRL LF (GAUZE/BANDAGES/DRESSINGS) ×3 IMPLANT
BANDAGE ELASTIC 6 VELCRO ST LF (GAUZE/BANDAGES/DRESSINGS) ×3 IMPLANT
BLADE 4.2CUDA (BLADE) IMPLANT
BLADE CUDA SHAVER 3.5 (BLADE) ×3 IMPLANT
BLADE GREAT WHITE 4.2 (BLADE) IMPLANT
BLADE GREAT WHITE 4.2MM (BLADE)
BNDG COHESIVE 6X5 TAN NS LF (GAUZE/BANDAGES/DRESSINGS) ×3 IMPLANT
CLOTH BEACON ORANGE TIMEOUT ST (SAFETY) ×3 IMPLANT
DRAPE ARTHROSCOPY W/POUCH 114 (DRAPES) ×3 IMPLANT
DRSG EMULSION OIL 3X3 NADH (GAUZE/BANDAGES/DRESSINGS) ×3 IMPLANT
DRSG PAD ABDOMINAL 8X10 ST (GAUZE/BANDAGES/DRESSINGS) ×3 IMPLANT
DURAPREP 26ML APPLICATOR (WOUND CARE) ×3 IMPLANT
ELECT REM PT RETURN 9FT ADLT (ELECTROSURGICAL)
ELECTRODE REM PT RTRN 9FT ADLT (ELECTROSURGICAL) IMPLANT
GAUZE SPONGE 4X4 12PLY STRL (GAUZE/BANDAGES/DRESSINGS) ×3 IMPLANT
GLOVE BIOGEL PI IND STRL 7.0 (GLOVE) IMPLANT
GLOVE BIOGEL PI INDICATOR 7.0 (GLOVE)
GLOVE SURG SS PI 7.0 STRL IVOR (GLOVE) ×3 IMPLANT
GLOVE SURG SS PI 7.5 STRL IVOR (GLOVE) IMPLANT
GLOVE SURG SS PI 8.0 STRL IVOR (GLOVE) ×3 IMPLANT
GOWN STRL REUS W/ TWL LRG LVL3 (GOWN DISPOSABLE) ×1 IMPLANT
GOWN STRL REUS W/ TWL XL LVL3 (GOWN DISPOSABLE) ×1 IMPLANT
GOWN STRL REUS W/TWL LRG LVL3 (GOWN DISPOSABLE) ×2
GOWN STRL REUS W/TWL XL LVL3 (GOWN DISPOSABLE) ×2
IV NS IRRIG 3000ML ARTHROMATIC (IV SOLUTION) ×6 IMPLANT
KIT RM TURNOVER CYSTO AR (KITS) ×3 IMPLANT
KNEE WRAP E Z 3 GEL PACK (MISCELLANEOUS) ×3 IMPLANT
MANIFOLD NEPTUNE II (INSTRUMENTS) ×3 IMPLANT
NDL SAFETY ECLIPSE 18X1.5 (NEEDLE) ×1 IMPLANT
NEEDLE FILTER BLUNT 18X 1/2SAF (NEEDLE) ×2
NEEDLE FILTER BLUNT 18X1 1/2 (NEEDLE) ×1 IMPLANT
NEEDLE HYPO 18GX1.5 SHARP (NEEDLE) ×2
PACK ARTHROSCOPY DSU (CUSTOM PROCEDURE TRAY) ×3 IMPLANT
PACK BASIN DAY SURGERY FS (CUSTOM PROCEDURE TRAY) ×3 IMPLANT
PAD ABD 8X10 STRL (GAUZE/BANDAGES/DRESSINGS) ×3 IMPLANT
PADDING CAST COTTON 6X4 STRL (CAST SUPPLIES) ×3 IMPLANT
PROBE BIPOLAR ATHRO 135MM 90D (MISCELLANEOUS) IMPLANT
SET ARTHROSCOPY TUBING (MISCELLANEOUS) ×2
SET ARTHROSCOPY TUBING LN (MISCELLANEOUS) ×1 IMPLANT
SHAVER 4.2 MM LANZA 9391A (BLADE) ×3 IMPLANT
SUT ETHILON 4 0 PS 2 18 (SUTURE) ×3 IMPLANT
SYR 30ML LL (SYRINGE) ×3 IMPLANT
SYRINGE 10CC LL (SYRINGE) ×3 IMPLANT
TOWEL OR 17X24 6PK STRL BLUE (TOWEL DISPOSABLE) ×3 IMPLANT
WAND HAND CNTRL MULTIVAC 90 (MISCELLANEOUS) ×3 IMPLANT
WATER STERILE IRR 500ML POUR (IV SOLUTION) ×3 IMPLANT

## 2017-01-20 NOTE — Anesthesia Procedure Notes (Signed)
Procedure Name: LMA Insertion Date/Time: 01/20/2017 1:27 PM Performed by: Tyrone NineSauve, Kristelle Cavallaro F, CRNA Pre-anesthesia Checklist: Patient identified, Emergency Drugs available, Suction available, Patient being monitored and Timeout performed Patient Re-evaluated:Patient Re-evaluated prior to induction Oxygen Delivery Method: Circle system utilized Preoxygenation: Pre-oxygenation with 100% oxygen Induction Type: IV induction Ventilation: Mask ventilation without difficulty LMA: LMA inserted LMA Size: 4.0 Number of attempts: 1 Placement Confirmation: breath sounds checked- equal and bilateral and CO2 detector Tube secured with: Tape Dental Injury: Teeth and Oropharynx as per pre-operative assessment

## 2017-01-20 NOTE — Anesthesia Preprocedure Evaluation (Addendum)
Anesthesia Evaluation  Patient identified by MRN, date of birth, ID band Patient awake    Reviewed: Allergy & Precautions, NPO status , Patient's Chart, lab work & pertinent test results  Airway Mallampati: II  TM Distance: >3 FB Neck ROM: Full    Dental  (+) Teeth Intact, Dental Advisory Given   Pulmonary Current Smoker (Smoked on DOS),    Pulmonary exam normal breath sounds clear to auscultation       Cardiovascular negative cardio ROS Normal cardiovascular exam Rhythm:Regular Rate:Normal     Neuro/Psych negative neurological ROS  negative psych ROS   GI/Hepatic negative GI ROS, Neg liver ROS,   Endo/Other  Hypothyroidism   Renal/GU negative Renal ROS     Musculoskeletal  (+) Arthritis , TMJ arthritis    Abdominal   Peds  Hematology negative hematology ROS (+)   Anesthesia Other Findings Day of surgery medications reviewed with the patient.  Reproductive/Obstetrics                            Anesthesia Physical Anesthesia Plan  ASA: II  Anesthesia Plan: General   Post-op Pain Management:    Induction: Intravenous  PONV Risk Score and Plan: 3 and Midazolam, Dexamethasone and Ondansetron  Airway Management Planned: LMA  Additional Equipment:   Intra-op Plan:   Post-operative Plan: Extubation in OR  Informed Consent: I have reviewed the patients History and Physical, chart, labs and discussed the procedure including the risks, benefits and alternatives for the proposed anesthesia with the patient or authorized representative who has indicated his/her understanding and acceptance.   Dental advisory given  Plan Discussed with: CRNA  Anesthesia Plan Comments: (Risks/benefits of general anesthesia discussed with patient including risk of damage to teeth, lips, gum, and tongue, nausea/vomiting, allergic reactions to medications, and the possibility of heart attack, stroke and  death.  All patient questions answered.  Patient wishes to proceed.)        Anesthesia Quick Evaluation

## 2017-01-20 NOTE — Discharge Instructions (Signed)
ARTHROSCOPIC KNEE SURGERY HOME CARE INSTRUCTIONS   PAIN You will be expected to have a moderate amount of pain in the affected knee for approximately two weeks.  However, the first two to four days will be the most severe in terms of the pain you will experience.  Prescriptions have been provided for you to take as needed for the pain.  The pain can be markedly reduced by using the ice/compressive bandage given.  Exchange the ice packs whenever they thaw.  During the night, keep the bandage on because it will still provide some compression for the swelling.  Also, keep the leg elevated on pillows above your heart, and this will help alleviate the pain and swelling.  MEDICATION Prescriptions have been provided to take as needed for pain. To prevent blood clots, take Aspirin 325mg  daily with a meal if not on a blood thinner and if no history of stomach ulcers.  ACTIVITY It is preferred that you stay on bedrest for approximately 24 hours.  However, you may go to the bathroom with help.  After this, you can start to be up and about progressively more.  Remember that the swelling may still increase after three to four days if you are up and doing too much.  You may put as much weight on the affected leg as pain will allow.  Use your crutches for comfort and safety.  However, as soon as you are able, you may discard the crutches and go without them.   DRESSING Keep the current dressing as dry as possible.  Two days after your surgery, you may remove the ice/compressive wrap, and surgical dressing.  You may now take a shower, but do not scrub the sounds directly with soap.  Let water rinse over these and gently wipe with your hand.  Reapply band-aids over the puncture wounds and more gauze if needed.  A slight amount of thin drainage can be normal at this time, and do not let it frighten you.  Reapply the ice/compressive wrap.  You may now repeat this every day each time you shower.  SYMPTOMS TO REPORT TO  YOUR DOCTOR  -Extreme pain.  -Extreme swelling.  -Temperature above 101 degrees that does not come down with acetaminophen     (Tylenol).  -Any changes in the feeling, color or movement of your toes.  -Extreme redness, heat, swelling or drainage at your incision  EXERCISE It is preferred that you begin to exercise on the day of your surgery.  Straight leg raises and short arc quads should be begun the afternoon or evening of surgery and continued until you come back for your follow-up appointment.   Attached is an instruction sheet on how to perform these two simple exercises.  Do these at least three times per day if not more.  You may bend your knee as much as is comfortable.  The puncture wounds may occasionally be slightly uncomfortable with bending of the knee.  Do not let this frighten you.  It is important to keep your knee motion, but do not overdo it.  If you have significant pain, simply do not bend the knee as far.   You will be given more exercises to perform at your first return visit.    RETURN APPOINTMENT Please make an appointment to be seen by your doctor in 10-1 days from your surgery.  Patient Signature:  ________________________________________________________  Nurse's Signature:  ________________________________________________________    Post Anesthesia Home Care Instructions  Activity: Get plenty  of rest for the remainder of the day. A responsible individual must stay with you for 24 hours following the procedure.  For the next 24 hours, DO NOT: -Drive a car -Advertising copywriterperate machinery -Drink alcoholic beverages -Take any medication unless instructed by your physician -Make any legal decisions or sign important papers.  Meals: Start with liquid foods such as gelatin or soup. Progress to regular foods as tolerated. Avoid greasy, spicy, heavy foods. If nausea and/or vomiting occur, drink only clear liquids until the nausea and/or vomiting subsides. Call your physician if  vomiting continues.  Special Instructions/Symptoms: Your throat may feel dry or sore from the anesthesia or the breathing tube placed in your throat during surgery. If this causes discomfort, gargle with warm salt water. The discomfort should disappear within 24 hours.  If you had a scopolamine patch placed behind your ear for the management of post- operative nausea and/or vomiting:  1. The medication in the patch is effective for 72 hours, after which it should be removed.  Wrap patch in a tissue and discard in the trash. Wash hands thoroughly with soap and water. 2. You may remove the patch earlier than 72 hours if you experience unpleasant side effects which may include dry mouth, dizziness or visual disturbances. 3. Avoid touching the patch. Wash your hands with soap and water after contact with the patch.

## 2017-01-20 NOTE — Transfer of Care (Signed)
Immediate Anesthesia Transfer of Care Note  Patient: Cassidy Walker  Procedure(s) Performed: LEFT KNEE ARTHROSCOPY, LATERAL MENISCECTOMY, CHONDROPLASTY (Left Knee)  Patient Location: PACU  Anesthesia Type:General  Level of Consciousness: awake, alert , oriented and patient cooperative  Airway & Oxygen Therapy: Patient Spontanous Breathing and Patient connected to nasal cannula oxygen  Post-op Assessment: Report given to RN and Post -op Vital signs reviewed and stable  Post vital signs: Reviewed and stable  Last Vitals:  Vitals:   01/20/17 1100  BP: 121/62  Pulse: 94  Resp: 16  Temp: 36.9 C  SpO2: 100%    Last Pain:  Vitals:   01/20/17 1130  TempSrc:   PainSc: 5       Patients Stated Pain Goal: 2 (01/20/17 1130)  Complications: No apparent anesthesia complications

## 2017-01-20 NOTE — Brief Op Note (Signed)
01/20/2017  2:11 PM  PATIENT:  Cassidy Walker  41 y.o. female  PRE-OPERATIVE DIAGNOSIS:    recurrent meniscus tear left knee  POST-OPERATIVE DIAGNOSIS:  recurrent meniscus tear left knee  PROCEDURE:  Procedure(s) with comments: LEFT KNEE ARTHROSCOPY, LATERAL MENISCECTOMY, CHONDROPLASTY (Left) - 60 mins  SURGEON:  Surgeon(s) and Role:    * Jene EveryBeane, Iley Deignan, MD - Primary  PHYSICIAN ASSISTANT:   ASSISTANTS: Bissel   ANESTHESIA:   general  EBL:  none   BLOOD ADMINISTERED:none  DRAINS: none   LOCAL MEDICATIONS USED:  MARCAINE     SPECIMEN:  No Specimen  DISPOSITION OF SPECIMEN:  N/A  COUNTS:  YES  TOURNIQUET:  * No tourniquets in log *  DICTATION: .Other Dictation: Dictation Number [id number]  PLAN OF CARE: Discharge to home after PACU  PATIENT DISPOSITION:  PACU - hemodynamically stable.   Delay start of Pharmacological VTE agent (>24hrs) due to surgical blood loss or risk of bleeding: no

## 2017-01-23 ENCOUNTER — Encounter (HOSPITAL_BASED_OUTPATIENT_CLINIC_OR_DEPARTMENT_OTHER): Payer: Self-pay | Admitting: Specialist

## 2017-01-23 NOTE — Op Note (Signed)
NAMTama Headings:  Cassidy Walker, Cassidy Walker               ACCOUNT NO.:  [id number]  MEDICAL RECORD NO.:  [id number]  LOCATION:                                 FACILITY:  PHYSICIAN:  Jene EveryJeffrey Emilliano Dilworth, M.D.         DATE OF BIRTH:  DATE OF PROCEDURE:  01/20/2017 DATE OF DISCHARGE:                              OPERATIVE REPORT   PREOPERATIVE DIAGNOSIS:  Posttraumatic chondromalacia; medial meniscus tear, left knee.  POSTOPERATIVE DIAGNOSIS:  Lateral meniscus tear, posttraumatic chondromalacia, medial femoral condyle, radial tearing of the medial meniscus.  PROCEDURES PERFORMED: 1. Left knee arthroscopy. 2. Partial lateral meniscectomy. 3. Shaving of the medial meniscus tear. 4. Chondroplasty of medial femoral condyle.  ANESTHESIA:  General.  ASSISTANT:  Lanna PocheJacqueline Bissell, PA.  HISTORY:  A 41 year old with history of meniscus, arthroscopy and partial meniscectomy.  Did well postoperatively and then had a fall directly onto her knee and then had persistent locking, popping giving way since then.  Assumed recurrent meniscus tear.  She was indicated for repeat arthroscopy with the risks and benefits including bleeding, infection, no changes in symptoms, worsening symptoms, DVT, PE, anesthetic complication, etc.  TECHNIQUE:  With the patient in supine position after induction of adequate general anesthesia, 2 g of Kefzol, left lower extremity was prepped and draped in usual sterile fashion.  A lateral parapatellar portal was fashioned with a #11 blade.  Ingress cannula was atraumatically placed.  Irrigant was utilized to insufflate the joint. Under direct visualization, a medial parapatellar portal was fashioned with a #11 blade after localization with 18-gauge needle sparing the medial meniscus.  Chondral flap tears of the medial femoral condyle were noted.  Light chondroplasty was performed here.  No grade 4 changes. Some radial tearing of the medial meniscus, this was shaved to a stable base.   Remnant was stable to probe palpation.  ACL was unremarkable.  Lateral compartment revealed a radial displaced tear of the junction of the mid and posterior third of the meniscus.  We introduced a resector and resected it to a stable base.  Approximately one-third of the posterior middle third junction was excised to a stable base, further contoured with 3.5 shaver.  The remnant stable to probe palpation.  Suprapatellar pouch had minor grade 3 changes of the patella.  Normal patellofemoral tracking.  Gutters were unremarkable.  Revisited all compartments.  No further pathology amenable to arthroscopic intervention and therefore, removed all instrumentations.  Portals were closed with 4-0 nylon simple sutures.  A 0.25% Marcaine with epinephrine was infiltrated in the joint.  Wound was dressed sterilely, awoken without difficulty and transported to the recovery room in satisfactory condition.  The patient tolerated the procedure well.  No complications.  Assistant, Lanna PocheJacqueline Bissell, [place].  Minimal blood loss.     Jene EveryJeffrey Karisa Nesser, M.D.   ______________________________ Jene EveryJeffrey Davanee Klinkner, M.D.    Cordelia PenJB/MEDQ  D:  01/20/2017  T:  01/21/2017  Job:  [id number]

## 2017-01-23 NOTE — Anesthesia Postprocedure Evaluation (Signed)
Anesthesia Post Note  Patient: Cassidy Walker  Procedure(s) Performed: LEFT KNEE ARTHROSCOPY, LATERAL MENISCECTOMY, CHONDROPLASTY (Left Knee)     Patient location during evaluation: PACU Anesthesia Type: General Level of consciousness: awake and alert Pain management: pain level controlled Vital Signs Assessment: post-procedure vital signs reviewed and stable Respiratory status: spontaneous breathing, nonlabored ventilation and respiratory function stable Cardiovascular status: blood pressure returned to baseline and stable Postop Assessment: no apparent nausea or vomiting Anesthetic complications: no    Last Vitals:  Vitals:   01/20/17 1430 01/20/17 1526  BP: 106/60 111/68  Pulse: 75 74  Resp: 15 16  Temp:  36.9 C  SpO2: 100% 100%    Last Pain:  Vitals:   01/20/17 1526  TempSrc:   PainSc: 6                  Cecile HearingStephen Edward Ileen Kahre
